# Patient Record
Sex: Female | Born: 1980 | Race: Black or African American | Hispanic: No | Marital: Single | Smoking: Former smoker
Health system: Southern US, Community
[De-identification: ages and names within clinical notes are randomized; demographics above are authoritative.]

## PROBLEM LIST (undated history)

## (undated) DIAGNOSIS — S82891A Other fracture of right lower leg, initial encounter for closed fracture: Secondary | ICD-10-CM

## (undated) DIAGNOSIS — N200 Calculus of kidney: Secondary | ICD-10-CM

## (undated) HISTORY — PX: KIDNEY STONE SURGERY: SHX686

## (undated) HISTORY — DX: Calculus of kidney: N20.0

## (undated) HISTORY — DX: Other fracture of right lower leg, initial encounter for closed fracture: S82.891A

---
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
## 2002-06-21 DIAGNOSIS — N200 Calculus of kidney: Secondary | ICD-10-CM

## 2002-06-21 HISTORY — DX: Calculus of kidney: N20.0

## 2002-07-26 ENCOUNTER — Other Ambulatory Visit: Admission: RE | Admit: 2002-07-26 | Discharge: 2002-07-26 | Payer: Self-pay | Admitting: Obstetrics and Gynecology

## 2002-12-30 ENCOUNTER — Inpatient Hospital Stay (HOSPITAL_COMMUNITY): Admission: AD | Admit: 2002-12-30 | Discharge: 2002-12-30 | Payer: Self-pay | Admitting: Obstetrics and Gynecology

## 2002-12-30 ENCOUNTER — Inpatient Hospital Stay (HOSPITAL_COMMUNITY): Admission: AD | Admit: 2002-12-30 | Discharge: 2003-01-02 | Payer: Self-pay | Admitting: Obstetrics and Gynecology

## 2003-04-16 ENCOUNTER — Emergency Department (HOSPITAL_COMMUNITY): Admission: AD | Admit: 2003-04-16 | Discharge: 2003-04-16 | Payer: Self-pay

## 2003-04-17 ENCOUNTER — Emergency Department (HOSPITAL_COMMUNITY): Admission: EM | Admit: 2003-04-17 | Discharge: 2003-04-17 | Payer: Self-pay | Admitting: Emergency Medicine

## 2003-04-19 ENCOUNTER — Emergency Department (HOSPITAL_COMMUNITY): Admission: EM | Admit: 2003-04-19 | Discharge: 2003-04-19 | Payer: Self-pay | Admitting: Emergency Medicine

## 2003-04-23 ENCOUNTER — Ambulatory Visit (HOSPITAL_COMMUNITY): Admission: AD | Admit: 2003-04-23 | Discharge: 2003-04-23 | Payer: Self-pay | Admitting: Urology

## 2003-09-12 ENCOUNTER — Emergency Department (HOSPITAL_COMMUNITY): Admission: EM | Admit: 2003-09-12 | Discharge: 2003-09-12 | Payer: Self-pay | Admitting: Family Medicine

## 2004-01-22 ENCOUNTER — Emergency Department (HOSPITAL_COMMUNITY): Admission: EM | Admit: 2004-01-22 | Discharge: 2004-01-22 | Payer: Self-pay | Admitting: Emergency Medicine

## 2004-08-12 ENCOUNTER — Emergency Department (HOSPITAL_COMMUNITY): Admission: EM | Admit: 2004-08-12 | Discharge: 2004-08-12 | Payer: Self-pay | Admitting: Emergency Medicine

## 2005-01-24 ENCOUNTER — Emergency Department (HOSPITAL_COMMUNITY): Admission: EM | Admit: 2005-01-24 | Discharge: 2005-01-24 | Payer: Self-pay | Admitting: Emergency Medicine

## 2006-01-19 ENCOUNTER — Emergency Department (HOSPITAL_COMMUNITY): Admission: EM | Admit: 2006-01-19 | Discharge: 2006-01-20 | Payer: Self-pay | Admitting: Emergency Medicine

## 2006-07-28 ENCOUNTER — Emergency Department (HOSPITAL_COMMUNITY): Admission: EM | Admit: 2006-07-28 | Discharge: 2006-07-28 | Payer: Self-pay | Admitting: Family Medicine

## 2006-10-14 ENCOUNTER — Encounter: Admission: RE | Admit: 2006-10-14 | Discharge: 2006-10-14 | Payer: Self-pay | Admitting: Family Medicine

## 2007-09-25 ENCOUNTER — Emergency Department (HOSPITAL_COMMUNITY): Admission: EM | Admit: 2007-09-25 | Discharge: 2007-09-25 | Payer: Self-pay | Admitting: Emergency Medicine

## 2008-03-21 ENCOUNTER — Emergency Department (HOSPITAL_COMMUNITY): Admission: EM | Admit: 2008-03-21 | Discharge: 2008-03-21 | Payer: Self-pay | Admitting: Family Medicine

## 2008-05-29 ENCOUNTER — Emergency Department (HOSPITAL_COMMUNITY): Admission: EM | Admit: 2008-05-29 | Discharge: 2008-05-29 | Payer: Self-pay | Admitting: Emergency Medicine

## 2009-01-11 ENCOUNTER — Inpatient Hospital Stay (HOSPITAL_COMMUNITY): Admission: AD | Admit: 2009-01-11 | Discharge: 2009-01-13 | Payer: Self-pay | Admitting: Obstetrics and Gynecology

## 2009-01-11 ENCOUNTER — Inpatient Hospital Stay (HOSPITAL_COMMUNITY): Admission: AD | Admit: 2009-01-11 | Discharge: 2009-01-11 | Payer: Self-pay | Admitting: Obstetrics and Gynecology

## 2010-07-09 ENCOUNTER — Emergency Department (HOSPITAL_COMMUNITY)
Admission: EM | Admit: 2010-07-09 | Discharge: 2010-07-09 | Payer: Self-pay | Source: Home / Self Care | Admitting: Family Medicine

## 2010-07-13 LAB — POCT PREGNANCY, URINE: Preg Test, Ur: NEGATIVE

## 2010-09-27 LAB — CBC
HCT: 31.6 % — ABNORMAL LOW (ref 36.0–46.0)
HCT: 37.1 % (ref 36.0–46.0)
Hemoglobin: 10.8 g/dL — ABNORMAL LOW (ref 12.0–15.0)
Hemoglobin: 12.5 g/dL (ref 12.0–15.0)
MCHC: 33.6 g/dL (ref 30.0–36.0)
MCHC: 34 g/dL (ref 30.0–36.0)
MCV: 82.6 fL (ref 78.0–100.0)
MCV: 83.6 fL (ref 78.0–100.0)
Platelets: 213 10*3/uL (ref 150–400)
Platelets: 233 10*3/uL (ref 150–400)
RBC: 3.78 MIL/uL — ABNORMAL LOW (ref 3.87–5.11)
RBC: 4.49 MIL/uL (ref 3.87–5.11)
RDW: 13.9 % (ref 11.5–15.5)
RDW: 13.9 % (ref 11.5–15.5)
WBC: 16.4 10*3/uL — ABNORMAL HIGH (ref 4.0–10.5)
WBC: 18.4 10*3/uL — ABNORMAL HIGH (ref 4.0–10.5)

## 2010-09-27 LAB — RPR: RPR Ser Ql: NONREACTIVE

## 2010-10-01 ENCOUNTER — Inpatient Hospital Stay (INDEPENDENT_AMBULATORY_CARE_PROVIDER_SITE_OTHER)
Admission: RE | Admit: 2010-10-01 | Discharge: 2010-10-01 | Disposition: A | Discharge status: Home / Self Care | Payer: Self-pay | Source: Ambulatory Visit | Attending: Emergency Medicine | Admitting: Emergency Medicine

## 2010-10-01 DIAGNOSIS — L03019 Cellulitis of unspecified finger: Secondary | ICD-10-CM

## 2010-10-13 ENCOUNTER — Inpatient Hospital Stay (INDEPENDENT_AMBULATORY_CARE_PROVIDER_SITE_OTHER)
Admission: RE | Admit: 2010-10-13 | Discharge: 2010-10-13 | Disposition: A | Discharge status: Home / Self Care | Payer: Self-pay | Source: Ambulatory Visit

## 2010-10-13 DIAGNOSIS — L03019 Cellulitis of unspecified finger: Secondary | ICD-10-CM

## 2010-11-03 NOTE — H&P (Signed)
NAME:  Susan Mcgrath, Susan Mcgrath                ACCOUNT NO.:  [id number]   MEDICAL RECORD NO.:  [id number]          PATIENT TYPE:  INP   LOCATION:  9145                          FACILITY:  [facility]   PHYSICIAN:  Hal Morales, M.D.DATE OF BIRTH:  [birth year]   DATE OF ADMISSION:  01/11/2009  DATE OF DISCHARGE:                              HISTORY & PHYSICAL   HISTORY OF PRESENT ILLNESS:  The patient is a 30 year old gravida 3,  para 1-0-1-1 who was admitted at 20 and 3/7th weeks with complaints of  regular and painful uterine contractions since 1:00 p.m. the day of  admission.  The patient denies leakage of fluid or bleeding.  The  patient's fetus has been moving normally.  The patient had been seen in  maternity admissions unit earlier in the day and discharged home due to  no cervical change.  The patient's pregnancy has been followed by the  CNM Service at [facility].  The patient presents remarkable  for  1. Positive group B strep.  2. History of kidney stone prior to pregnancy.  3. History of tric and gonorrhea in the past.  4. UTI with MRSA during pregnancy.   HISTORY OF PRESENT PREGNANCY:  The patient entered care at 79 weeks'  gestation.  The patient was treated with Macrobid at that time and urine  culture sent.  The patient also with bacterial vaginosis on wet prep and  was treated with MetroGel.  The patient's new OB lab, as well as all  screening tests were obtained at that time.  The patient's initial urine  with staph.  Antibiotics were changed at that time to Cipro.  At 19  weeks, ultrasound was obtained for anatomy with single intrauterine  pregnancy consistent with date.  Cervix 3.95 cm.  Normal anatomy.  The  patient declined quadruple screen at that time.  At 21 weeks', the  patient with complaints of a lesion on her lower abdomen consistent with  a blood follicle.  At that time, the patient was seen.  The patient with  no drainage noted and a solid nodule with  cellulitis.  The patient was  started on Keflex at that time.  The patient continued to have increased  pain and swelling of the area and at second visit at 23 weeks and 2  days, area measured 5-7 cm.  The patient was instructed to continue with  her antibiotics, but her course was extended to 10 days.  Followup visit  at 24 weeks revealed resolving lesion.  At 27 weeks, Glucola was given.  The abdominal lesion was resolved.  The patient's Glucola results 103,  hemoglobin of 10.9, and nonreactive RPR.  The patient was started on  over-the-counter iron a a result of her hemoglobin.  At 34 weeks, the  patient's fundal height was noted to be 37 cm and therefore ultrasound  was ordered to evaluate.  At 36 weeks, the patient underwent ultrasound  with estimated fetal weight at the 72.6 percentile and normal amniotic  fluid index.  Group B strep was collected at 36 weeks.  At 37  weeks, the  patient was 2+ glycosuria at the time of her office visit and Dextrostix  obtained at that time was 156; however, the patient reported consuming  two cups of fruit punch prior to visit.  Gonorrhea, chlamydia cultures  at 36 weeks were negative.  At 38 weeks, the patient with no complaints  and cervical exam was 1-2 cm, 40-50% effaced, -2 station, and vertex.  The patient with some complaints of some insomnia and comfort measures  were discussed with her.  Remainder of the patient's pregnancy was  uneventful.   PRENATAL LABORATORY DATA:  Initial prenatal labs, blood type A+,  antibody screen negative, hemoglobin 11.7, hematocrit 35.8, platelets  321,000.  Antibody screen negative, sickle cell trait negative, RPR  nonreactive, rubella titer immune, hepatitis B surface antigen negative,  HIV nonreactive.  Pap smear within normal limits.  Gonorrhea and  chlamydia cultures negative.  Quad screen was declined.  One-hour  Glucola at 27 weeks' 103, hemoglobin at 27 weeks 10.9, RPR 27 weeks  nonreactive.  Gonorrhea  and chlamydia cultures at 36 weeks negative.  Group B strep was positive.   Pregnancy #1, July 2004, spontaneous vaginal delivery, 40 weeks'  gestation.  A 7-hour labor.  Female infant 7 pounds 4 ounces.  No  complications.  Pregnancy #2 in 2005, elective interruption of pregnancy  at [redacted] weeks gestation.  Pregnancy #3 is current.   GYNECOLOGICAL HISTORY:  The patient with regular monthly menses.  The  patient is uncertain of LMP.  The patient has used condoms, Depo-  Provera, and birth control pills in the past.  The patient with history  of Trichomonas in 2007.  The patient with a history of gonorrhea in her  last pregnancy.  The patient does have a history of frequent yeast  infections in the past.  The patient with no previous history of  abnormal Pap.   MEDICAL HISTORY:  Remarkable for bare UTI, otherwise negative.   SURGICAL HISTORY:  Wisdom teeth 2004, removal of kidney stones.   FAMILY HISTORY:  Maternal grandmother with Alzheimer's, father with  bipolar disorder, and history of drug use.   GENETIC HISTORY:  The baby's sisters are fraternal twins.  Otherwise  negative.   SOCIAL HISTORY:  The patient is single.  The patient is a Pharmacist, hospital and a Consulting civil engineer.  The patient denies his use of alcohol, tobacco,  or street drugs.  Father of the baby, Doneta Public, is involved and supportive  and is a Consulting civil engineer.  The patient does not have a particular religious  affiliation.   ALLERGIES:  No known drug allergies.   CURRENT MEDICATIONS:  Prenatal vitamins in the year.   OBJECTIVE:  GENERAL:  The patient is afebrile.  The patient is an alert,  oriented in no apparent distress other than discomfort with contractions  and breathing with contractions.  VITAL SIGNS:  Blood pressure 112/78, heart rate 98, respirations 16.  SKIN:  The patient's skin is warm and dry.  Color is satisfactory.  NECK:  Thyroid without nodules, masses, or tenderness and no  lymphadenopathy is present.  HEART:   There is regular rate and rhythm without murmur, rub, or gallop.  LUNGS:  Clear to AP auscultation.  ABDOMEN:  Soft, gravid, and nontender.  Positive bowel sounds x4  quadrants.  Fundal height 41 cm.  Fetus is vertex to Leopold's  maneuvers.  Fetal heart rate baseline 120s with accelerations present  and variability present.  No decelerations are noted.  Uterine  contractions are noted to be every 4-5 minutes and strong to palpation.  Sterile vaginal exam of the cervix and found cervix to be 8 cm dilated,  completely effaced, -2 station and vertex for RN.  EXTREMITIES:  Negative edema, negative Homans bilaterally.  Deep tendon  reflexes are 1+ with no clonus.   ASSESSMENT:  1. Intrauterine pregnancy at 35 and 3/7th weeks gestation.  2. Active labor.   PLAN:  The patient to be admitted to birthing suite per consult with Dr.  Pennie Rushing.  Ampicillin was started for positive group B strep due to  advanced labor.  The patient desires epidural for anesthesia and  epidural was ordered.  Anticipate a vaginal delivery.      Rhona Leavens, CNM      Hal Morales, M.D.  Electronically Signed    NOS/MEDQ  D:  01/12/2009  T:  01/12/2009  Job:  [id number]

## 2010-11-06 NOTE — Op Note (Signed)
NAME:  Susan Mcgrath, Susan Mcgrath NO.:  [id number]   MEDICAL RECORD NO.:  [id number]                   PATIENT TYPE:  AMB   LOCATION:  DAY                                  FACILITY:  [facility]   PHYSICIAN:  [place] Service, M.D.             DATE OF BIRTH:  January 18, 1981   DATE OF PROCEDURE:  DATE OF DISCHARGE:                                 OPERATIVE REPORT   REFERRED BY:  Janine Limbo, M.D.   UROLOGIST:  [place] Service, M.D.   PREOPERATIVE DIAGNOSIS:  4 mm calculus left distal ureter.   POSTOPERATIVE DIAGNOSIS:  4 mm calculus left distal ureter.   PROCEDURE:  Cystoscopy, retrograde ureteroscopy and double J stent  placement.   INDICATIONS FOR PROCEDURE:  A 30 year old female [name] April 17, 2003 CT abdomen and pelvis moderate left hydronephrosis on the basis of a  distal ureteral stone measuring 2 x 4 mm.  The patient's has had six days  of pain, nausea and vomiting. KUB in the office showed suggestion of  persistent stone in the region of the left UVJ. Discussed situation with  family members, elected to proceed with cystoscopy retrograde and  ureteroscopy if necessary.   ANESTHESIA:  General.   DESCRIPTION OF PROCEDURE:  The patient was prepped and draped in the dorsal  lithotomy position after institution of an adequate level of general  anesthesia (LMA).  A 21 French panendoscope was gently inserted at the  urethral meatus, normal urethra and sphincter. Clear efflux right orifice.  The patient had bullous edema and minimal efflux at the left orifice. Right  retrograde was performed, normal course and caliber of the ureter, pelvis  and calices with prompt drainage in 2-3 minutes. Left retrograde showed  collection of small defects in the distal ureter with proximal  hydronephrosis. A guidewire was negotiated beyond the filling defects, end  hole catheter was inserted over the guidewire, left in place for 5 minutes.  The guidewire  was left in place, the end hole catheter was withdrawn. There  was immediate efflux of copious amounts of cloudy urine from the left  ureteral orifice. This was allowed to subside. The ureteroscope was inserted  along side the guidewire.  A collection of dilated proximal ureter with  distal ureter showing edema erythema consistent with previous stone. A  collection of small fragments were identified, no single large stone could  be identified however. The ureteroscope was inserted to the level of the  left UPJ, no proximal calculi were identified. The ureteroscope was then  withdrawn. Due to intense spasm of the distal ureter, a decision was made to  place a double J stent 6 French 24 cm over the indwelling guidewire.  Excellent pigtail formation on guidewire removal. The bladder was drained,  cystoscope was removed. The patient was given a B&O suppository and returned  to recovery in satisfactory condition.  [place] Service, M.D.    RH/MEDQ  D:  04/23/2003  T:  04/23/2003  Job:  [id number]

## 2010-11-06 NOTE — H&P (Signed)
NAME:  Susan Mcgrath, Susan Mcgrath NO.:  [id number]   MEDICAL RECORD NO.:  [id number]                   PATIENT TYPE:  INP   LOCATION:  9148                                 FACILITY:  [facility]   PHYSICIAN:  Janine Limbo, M.D.            DATE OF BIRTH:  1981-05-21   DATE OF ADMISSION:  12/30/2002  DATE OF DISCHARGE:                                HISTORY & PHYSICAL   Susan Mcgrath is a 30 year old gravida 1, para 0, EDD January 01, 2003, at 39-5/7  weeks by 18 week ultrasound confirmed with followup.  She presents with  increasing labor symptoms, contractions, increasing in frequency and  intensity since this afternoon.  She reports positive fetal movement.  No  bleeding.  No rupture of membranes.  No PIH symptoms.  She was evaluated  earlier today in MAU and sent home in prodromal labor.  Her cervix was found  to be one fingertip to one cm at that time and unchanged.  Her pregnancy has  been followed by the C.N.M. service at [facility] and is remarkable for (1) late  care, (2) gonorrhea in the second trimester, test of cure negative, (3)  Trichomonas in the first trimester which was treated, (4) positive group B  strep.   OBSTETRIC HISTORY:  Present pregnancy.   PRENATAL LAB WORK:  On July 26, 2002, hemoglobin and hematocrit 10.8 and  32.5.  Platelets 328,000.  Blood type and Rh A positive, antibody screen  negative, sickle cell trait negative, VDRL nonreactive, rubella immune.  Hepatitis B surface antigen negative.  HIV nonreactive.  On July 26, 2002, GC positive, Chlamydia negative.  On August 28, 2002, GC and Chlamydia  negative and also at 36 weeks on December 05, 2002 GC and Chlamydia negative.  Pap smear July 26, 2002 showed Trichomonas which was treated.  Quad  screen within normal range.  At 28 weeks, one hour glucose challenge 88 and  hemoglobin 11.1.  RPR nonreactive at that time at 36 weeks.  Culture of the  vaginal tract was positive for group B strep.   MEDICAL HISTORY:  Unremarkable.   SURGICAL HISTORY:  Wisdom teeth 2002.   FAMILY HISTORY:  Maternal grandmother with Alzheimer's disease.  Genetic  history - there is no history of familial or chromosomal disorder, died in  infancy, or that were born with birth defects.  The patient has no known  drug allergies and denies the use of tobacco, alcohol, or illicit drugs.   SOCIAL HISTORY:  Susan Mcgrath is a 30 year old single African-American female,  father of a baby, Susan Mcgrath.  The patient is Susan Mcgrath in her faith.  She  began prenatal care at [facility] on July 26, 2002 at 18 weeks.  Her pregnancy  was essentially unremarkable except for positive GC in second trimester  treated with Rocephin, Trichomonas treated with metronidazole.  Otherwise,  the patient has been size equal to  date throughout, normotensive with  proteinuria.   REVIEW OF SYSTEMS:  There are no signs or symptoms suggestive of focal or  systemic disease and the patient is typical of one with an uterine pregnancy  at term in early labor.   PHYSICAL EXAMINATION:  Vital signs stable, afebrile.  HEENT:  Unremarkable.  HEART:  Regular rate and rhythm.  LUNGS:  Clear.  ABDOMEN:  Gravid in its contour.  Uterine fundus is noted to extend 38 cm  above the level of the pubic symphysis.  Leopold's maneuvers finds the  infant to be in a longitudinal lie.  Cephalic presentation and the estimated  fetal weight is 7 1/2 pounds.  The fetal heart rate baseline is 140,  positive variability.  There is no reactivity at the present time.  The  strip is reassuring with occasional mild variable deceleration.  The patient  is contracting every two to five minutes.  Digital exam of the cervix is  found to be 4 cm dilated, 90% effaced, with cephalic presenting part and a  minus 1 station and a bulging bag of water.  EXTREMITIES:  Show no pathologic edema.  DTRs are 1+ with no clonus.   ASSESSMENT:  Intrauterine pregnancy at term, early active  labor.   PLAN:  Admit per Dr. Marline Backbone.  Routine C.N.M. orders.  Penicillin-G  prophylaxis for positive group B strep.  The patient may have an epidural.     Susan Mcgrath, C.N.M.               Janine Limbo, M.D.    SDM/MEDQ  D:  12/31/2002  T:  12/31/2002  Job:  [id number]

## 2010-12-28 ENCOUNTER — Inpatient Hospital Stay (INDEPENDENT_AMBULATORY_CARE_PROVIDER_SITE_OTHER)
Admission: RE | Admit: 2010-12-28 | Discharge: 2010-12-28 | Disposition: A | Discharge status: Home / Self Care | Payer: Self-pay | Source: Ambulatory Visit | Attending: Family Medicine | Admitting: Family Medicine

## 2010-12-28 DIAGNOSIS — Z3201 Encounter for pregnancy test, result positive: Secondary | ICD-10-CM

## 2010-12-28 LAB — POCT URINALYSIS DIP (DEVICE)
Bilirubin Urine: NEGATIVE
Glucose, UA: NEGATIVE mg/dL
Ketones, ur: NEGATIVE mg/dL
Nitrite: NEGATIVE
Protein, ur: NEGATIVE mg/dL
Specific Gravity, Urine: 1.02 (ref 1.005–1.030)
Urobilinogen, UA: 0.2 mg/dL (ref 0.0–1.0)
pH: 7 (ref 5.0–8.0)

## 2010-12-28 LAB — POCT PREGNANCY, URINE: Preg Test, Ur: POSITIVE

## 2011-03-22 LAB — POCT RAPID STREP A: Streptococcus, Group A Screen (Direct): NEGATIVE

## 2011-03-26 LAB — URINALYSIS, ROUTINE W REFLEX MICROSCOPIC
Bilirubin Urine: NEGATIVE
Glucose, UA: NEGATIVE mg/dL
Ketones, ur: >80 mg/dL — AB
Leukocytes, UA: NEGATIVE
Nitrite: NEGATIVE
Protein, ur: 30 mg/dL — AB
Specific Gravity, Urine: 1.034 — ABNORMAL HIGH (ref 1.005–1.030)
Urobilinogen, UA: 0.2 mg/dL (ref 0.0–1.0)
pH: 5.5 (ref 5.0–8.0)

## 2011-03-26 LAB — URINE MICROSCOPIC-ADD ON

## 2011-03-26 LAB — POCT PREGNANCY, URINE: Preg Test, Ur: POSITIVE

## 2011-04-21 ENCOUNTER — Inpatient Hospital Stay (INDEPENDENT_AMBULATORY_CARE_PROVIDER_SITE_OTHER)
Admission: RE | Admit: 2011-04-21 | Discharge: 2011-04-21 | Disposition: A | Discharge status: Home / Self Care | Payer: Self-pay | Source: Ambulatory Visit | Attending: Family Medicine | Admitting: Family Medicine

## 2011-04-21 ENCOUNTER — Ambulatory Visit (INDEPENDENT_AMBULATORY_CARE_PROVIDER_SITE_OTHER): Payer: Self-pay

## 2011-04-21 DIAGNOSIS — S93409A Sprain of unspecified ligament of unspecified ankle, initial encounter: Secondary | ICD-10-CM

## 2011-04-21 DIAGNOSIS — S82891A Other fracture of right lower leg, initial encounter for closed fracture: Secondary | ICD-10-CM

## 2011-04-21 HISTORY — DX: Other fracture of right lower leg, initial encounter for closed fracture: S82.891A

## 2011-05-09 ENCOUNTER — Emergency Department (HOSPITAL_COMMUNITY)
Admission: EM | Admit: 2011-05-09 | Discharge: 2011-05-09 | Disposition: A | Discharge status: Home / Self Care | Payer: Self-pay | Source: Home / Self Care | Attending: Emergency Medicine | Admitting: Emergency Medicine

## 2011-05-09 ENCOUNTER — Encounter: Payer: Self-pay | Admitting: *Deleted

## 2011-05-09 ENCOUNTER — Emergency Department (INDEPENDENT_AMBULATORY_CARE_PROVIDER_SITE_OTHER): Payer: Self-pay

## 2011-05-09 DIAGNOSIS — S93401A Sprain of unspecified ligament of right ankle, initial encounter: Secondary | ICD-10-CM

## 2011-05-09 DIAGNOSIS — S93409A Sprain of unspecified ligament of unspecified ankle, initial encounter: Secondary | ICD-10-CM

## 2011-05-09 NOTE — ED Provider Notes (Signed)
History     CSN: [id number] Arrival date & time: 05/09/2011  7:15 PM   First MD Initiated Contact with Patient 05/09/11 1840      Chief Complaint  Patient presents with  . Foot Pain    (Consider location/radiation/quality/duration/timing/severity/associated sxs/prior treatment) HPI Comments: She injured her right ankle on October 31. She fell and twisted the ankle. She was seen here and had the ankle x-rayed. The x-rays were negative for fracture. She was given crutches and an ASO brace. Ever since then she's had continued pain in the ankle and swelling. It hurts over the lateral malleolus. Pain is worse with weightbearing and with movement. Her toes feel numb and tingly.  Patient is a 30 y.o. female presenting with lower extremity pain.  Foot Pain    History reviewed. No pertinent past medical history.  History reviewed. No pertinent past surgical history.  History reviewed. No pertinent family history.  History  Substance Use Topics  . Smoking status: Current Everyday Smoker  . Smokeless tobacco: Not on file  . Alcohol Use: Yes    OB History    Grav Para Term Preterm Abortions TAB SAB Ect Mult Living                  Review of Systems  Musculoskeletal: Positive for arthralgias and gait problem. Negative for myalgias, back pain and joint swelling.  Skin: Negative for rash and wound.  Neurological: Negative for weakness and numbness.    Allergies  Review of patient's allergies indicates no known allergies.  Home Medications  No current outpatient prescriptions on file.  BP 115/59  Pulse 98  Temp(Src) 98.5 F (36.9 C) (Oral)  Resp 17  SpO2 100%  LMP 03/17/2011  Physical Exam  Nursing note and vitals reviewed. Constitutional: She is oriented to person, place, and time. She appears well-developed and well-nourished. No distress.  Musculoskeletal: Normal range of motion. She exhibits edema and tenderness.       There was slight swelling and tenderness to  palpation over the lateral malleolus. The ankle has a full range of motion actively and passively but with pain. Drawer sign was negative. Sensation was intact, muscle strength normal, and pulses were full.  Neurological: She is alert and oriented to person, place, and time. She has normal strength and normal reflexes. She displays no atrophy. No sensory deficit. She exhibits normal muscle tone.  Skin: Skin is warm and dry. No rash noted. She is not diaphoretic.    ED Course  Procedures (including critical care time)  Labs Reviewed - No data to display Dg Ankle Complete Right  05/09/2011  *RADIOLOGY REPORT*  Clinical Data: Persistent right ankle pain after injury on 04/21/2011.  RIGHT ANKLE - COMPLETE 3+ VIEW 05/09/2011:  Comparison: Right ankle x-rays 04/21/2011.  Findings: No evidence of acute, subacute, or healed fractures. Ankle mortise intact with well-preserved joint space.  No intrinsic osseous abnormalities.  No evidence of a significant joint effusion.  Interval resolution of the lateral soft tissue swelling.  IMPRESSION: Normal examination.  Original Report Authenticated By: Arnell Sieving, M.D.     1. Sprain of right ankle       MDM  She has a persistently swollen painful ankle after a twisting injury and over 2 weeks ago. Her x-ray was negative. Neuropsych she has a sprain, although I did recommend she followup with orthopedist. She was given some exercises to do and will switch to a Cam Walker.  Roque Lias, MD 05/09/11 2033

## 2011-05-09 NOTE — ED Notes (Signed)
After taking patient back to room from x-ray I explained to patient that the Radiologist at the hospital would read the x-rays.  I informed patient that process could take up to 45 minutes.  Patient stated she would not wait an additional 45 minutes.  Patient then stated "hold up.  I thought you were closed."  I explained to patient that yes we were closed but we stay until all patients are seen.

## 2011-05-09 NOTE — ED Notes (Signed)
Co pain in right foot, seen here on 10/31, had xrays, neg for fracture, states she is still using crutches and feels like it is not getting better

## 2011-05-24 ENCOUNTER — Ambulatory Visit (INDEPENDENT_AMBULATORY_CARE_PROVIDER_SITE_OTHER): Payer: Self-pay | Admitting: Family Medicine

## 2011-05-24 DIAGNOSIS — S93409A Sprain of unspecified ligament of unspecified ankle, initial encounter: Secondary | ICD-10-CM | POA: Insufficient documentation

## 2011-05-24 NOTE — Assessment & Plan Note (Signed)
Has had her sprain in a walking boot for a month. No fractures on xrays. NSAIDS prn PT and mobilization. Would dc walking boot.

## 2011-05-24 NOTE — Progress Notes (Signed)
  Subjective:    Susan Mcgrath is a 30 y.o. female who presents with right ankle pain. Onset of the symptoms was about a month ago. Inciting event: injured while falling from standing. Current symptoms include: ability to bear weight, but with some pain, pain with inversion of the foot, stiffness and swelling. Aggravating factors: direct pressure, standing, walking  and weight bearing. Symptoms have gradually improved. Patient has had no prior ankle problems. Evaluation to date: plain films: normal. Treatment to date: prescription NSAIDS which are somewhat effective.   Objective:    BP 120/60  Ht 5\' 6"  (1.676 m)  Wt 160 lb (72.576 kg)  BMI 25.82 kg/m2 Right ankle:   negative findings: no erythema, no ecchymosis, no effusion and no ligamentous laxity positive findings: decreased range of motion and tenderness over lateral talo-fibular  ligament right ankle swelling  Left ankle:   normal   Imaging: X-ray of the right ankle(s): no fracture, dislocation, swelling or degenerative changes noted    Assessment:    Ankle sprain  moderate  d/c cam walker boot---she is not sure she can walk without it so we discussed gradual d/c with interim return to ASO brace. HEP rtc 2-3 w Plan:    NSAIDs per medication orders. PT referral.

## 2011-06-11 ENCOUNTER — Ambulatory Visit: Payer: Self-pay | Admitting: Family Medicine

## 2011-12-14 ENCOUNTER — Ambulatory Visit (INDEPENDENT_AMBULATORY_CARE_PROVIDER_SITE_OTHER): Payer: Self-pay | Admitting: Family Medicine

## 2011-12-14 ENCOUNTER — Encounter: Payer: Self-pay | Admitting: Family Medicine

## 2011-12-14 VITALS — BP 113/75 | HR 103 | Temp 98.5°F | Ht 66.0 in | Wt 166.0 lb

## 2011-12-14 DIAGNOSIS — G629 Polyneuropathy, unspecified: Secondary | ICD-10-CM

## 2011-12-14 DIAGNOSIS — J029 Acute pharyngitis, unspecified: Secondary | ICD-10-CM

## 2011-12-14 DIAGNOSIS — G589 Mononeuropathy, unspecified: Secondary | ICD-10-CM

## 2011-12-14 LAB — POCT RAPID STREP A (OFFICE): Rapid Strep A Screen: NEGATIVE

## 2011-12-14 NOTE — Progress Notes (Signed)
Subjective:     Patient ID: Susan Mcgrath, female   DOB: 15-Jun-1981, 31 y.o.   MRN: [id number]  HPI 31 yo F new patient presents to establish primary care and discuss the following:  1. Groggy and tired x 1 week: associated with tingling in hands and feet and feeling sweaty. Patient denies sick contacts, fever, chills, poor sleep, polydipsia and polyuria, change in appetite and change in weight.   2. Cold symptoms: intermittent cough with sore throat. Off an on for a few weeks.   Past Medical History  Diagnosis Date  . Kidney stone 2004    Had stone removal   . Fracture of right ankle 04/21/11    Had casting no surgery    History reviewed. No pertinent past surgical history.  Family History  Problem Relation Age of Onset  . Alzheimer's disease Maternal Grandmother   . Alzheimer's disease Paternal Grandmother    History   Social History Narrative  . No narrative on file    Review of Systems MSK: intermittent R side pain.  GU: denies dysuria.   Objective:   Physical Exam BP 113/75  Pulse 103  Temp 98.5 F (36.9 C) (Oral)  Ht 5\' 6"  (1.676 m)  Wt 166 lb (75.297 kg)  BMI 26.79 kg/m2 General appearance: alert, cooperative and no distress Eyes: conjunctivae/corneas clear. PERRL, EOM's intact.  Ears: abnormal external canal right ear - cerumen removed by irrigation and normal TM noted after wax removal and abnormal external canal left ear - cerumen removed by irrigation and normal TM noted after wax removal Nose: Nares normal. Septum midline. Mucosa normal. No drainage or sinus tenderness. Throat: lips, mucosa, and tongue normal; teeth and gums normal Neck: no adenopathy, no carotid bruit, no JVD, supple, symmetrical, trachea midline and thyroid not enlarged, symmetric, no tenderness/mass/nodules Lungs: clear to auscultation bilaterally Heart: regular rate and rhythm, S1, S2 normal, no murmur, click, rub or gallop Abdomen: soft, non-tender; bowel sounds normal; no masses,   no organomegaly Extremities: extremities normal, atraumatic, no cyanosis or edema Pulses: 2+ and symmetric Skin: Skin color, texture, turgor normal. No rashes or lesions Neurologic: Grossly normal  Rapid strep: negative  Assessment and Plan:  See problem list

## 2011-12-14 NOTE — Patient Instructions (Addendum)
Eliska,  Thank you for coming in today. Please return once you have insurance for further evaluation of your symptoms. In the meantime please drink plenty of fluids (mostly water) and get as much rest as possible.   Dr. Armen Pickup

## 2011-12-14 NOTE — Assessment & Plan Note (Addendum)
A: tingling and numbness in extremities associated with constitutional symptoms. Unclear etiology. I have considered diabetes and thyroid dysfuction but patient has no other symptoms, considered viral infection which is highly possible given the acute onset of symptoms and short course thus far.  P:  -reassurance -will check labs: CMET and TSH at f/u visit.  -reviewed s/s to prompt patient to return to care sooner i.e. Acutely worsening symptoms.

## 2012-01-21 ENCOUNTER — Emergency Department (HOSPITAL_COMMUNITY)
Admission: EM | Admit: 2012-01-21 | Discharge: 2012-01-21 | Disposition: A | Discharge status: Home / Self Care | Payer: Medicaid Other | Attending: Emergency Medicine | Admitting: Emergency Medicine

## 2012-01-21 ENCOUNTER — Encounter (HOSPITAL_COMMUNITY): Payer: Self-pay | Admitting: *Deleted

## 2012-01-21 DIAGNOSIS — Z711 Person with feared health complaint in whom no diagnosis is made: Secondary | ICD-10-CM

## 2012-01-21 DIAGNOSIS — F17200 Nicotine dependence, unspecified, uncomplicated: Secondary | ICD-10-CM | POA: Insufficient documentation

## 2012-01-21 DIAGNOSIS — Z008 Encounter for other general examination: Secondary | ICD-10-CM | POA: Insufficient documentation

## 2012-01-21 LAB — CBC WITH DIFFERENTIAL/PLATELET
Basophils Absolute: 0 10*3/uL (ref 0.0–0.1)
Basophils Relative: 1 % (ref 0–1)
Eosinophils Absolute: 0.2 10*3/uL (ref 0.0–0.7)
Eosinophils Relative: 2 % (ref 0–5)
HCT: 39.1 % (ref 36.0–46.0)
Hemoglobin: 13 g/dL (ref 12.0–15.0)
Lymphocytes Relative: 30 % (ref 12–46)
Lymphs Abs: 2.5 10*3/uL (ref 0.7–4.0)
MCH: 26.9 pg (ref 26.0–34.0)
MCHC: 33.2 g/dL (ref 30.0–36.0)
MCV: 80.8 fL (ref 78.0–100.0)
Monocytes Absolute: 0.6 10*3/uL (ref 0.1–1.0)
Monocytes Relative: 7 % (ref 3–12)
Neutro Abs: 5 10*3/uL (ref 1.7–7.7)
Neutrophils Relative %: 60 % (ref 43–77)
Platelets: 353 10*3/uL (ref 150–400)
RBC: 4.84 MIL/uL (ref 3.87–5.11)
RDW: 13 % (ref 11.5–15.5)
WBC: 8.2 10*3/uL (ref 4.0–10.5)

## 2012-01-21 LAB — BASIC METABOLIC PANEL
BUN: 9 mg/dL (ref 6–23)
CO2: 27 mEq/L (ref 19–32)
Calcium: 9.7 mg/dL (ref 8.4–10.5)
Chloride: 105 mEq/L (ref 96–112)
Creatinine, Ser: 0.69 mg/dL (ref 0.50–1.10)
GFR calc Af Amer: >90 mL/min (ref >90)
GFR calc non Af Amer: >90 mL/min (ref >90)
Glucose, Bld: 75 mg/dL (ref 70–99)
Potassium: 3.7 mEq/L (ref 3.5–5.1)
Sodium: 140 mEq/L (ref 135–145)

## 2012-01-21 NOTE — ED Provider Notes (Signed)
History     CSN: [id number]  Arrival date & time 01/21/12  1314   First MD Initiated Contact with Patient 01/21/12 1404      Chief Complaint  Patient presents with  . Numbness    (Consider location/radiation/quality/duration/timing/severity/associated sxs/prior treatment) HPI  A generally healthy 31 year old female presents complaining of numbness to her feet. Patient states for the past month she has had aching and numbness sensation to the sole of both of her feet. Symptom has not improved nor get worse. Numbness is constant without associated pain. She denies any swelling, or rash. She denies taking medication a regular basis or any medication changes. She denies history of diabetes. Patient denies having trouble walking or having foot drop. No associated back pain, knee pain, or ankle pain. She denies any neuro-related disease in family. She does report feeling tired also usually in the past 3 weeks. Denies polydipsia, polyuria, or abnormal weight changes.  Past Medical History  Diagnosis Date  . Kidney stone 2004    Had stone removal   . Fracture of right ankle 04/21/11    Had casting no surgery     Past Surgical History  Procedure Date  . Kidney stone surgery     Family History  Problem Relation Age of Onset  . Alzheimer's disease Maternal Grandmother   . Alzheimer's disease Paternal Grandmother     History  Substance Use Topics  . Smoking status: Current Some Day Smoker -- 0.0 packs/day for 2 years    Types: Cigarettes  . Smokeless tobacco: Never Used  . Alcohol Use: Yes     Socially. Smokes when she drinks.     OB History    Grav Para Term Preterm Abortions TAB SAB Ect Mult Living   3 2 2  1 1    2       Review of Systems  All other systems reviewed and are negative.    Allergies  Review of patient's allergies indicates no known allergies.  Home Medications  No current outpatient prescriptions on file.  BP 112/68  Pulse 85  Temp 98.2 F (36.8  C) (Oral)  Resp 16  Ht 5\' 6"  (1.676 m)  Wt 162 lb (73.483 kg)  BMI 26.15 kg/m2  SpO2 100%  Physical Exam  Nursing note and vitals reviewed. Constitutional: She is oriented to person, place, and time. She appears well-developed and well-nourished. No distress.       Awake, alert, nontoxic appearance  HENT:  Head: Atraumatic.  Eyes: Conjunctivae are normal. Right eye exhibits no discharge. Left eye exhibits no discharge.  Neck: Neck supple.  Cardiovascular: Normal rate and regular rhythm.   Pulmonary/Chest: Effort normal. No respiratory distress. She exhibits no tenderness.  Abdominal: Soft. There is no tenderness. There is no rebound.  Musculoskeletal: Normal range of motion. She exhibits no edema and no tenderness.       ROM appears intact, no obvious focal weakness  Patient socially is unremarkable. Hip, knees, and ankles nontender on exam with full range of motion. Sensation is intact light touch throughout both feet. Pedal pulse palpable bilaterally. Normal skin tone, normal warmth.    No foot drop. Normal gait. Patellar deep tendon reflex 2+ bilaterally.  Lower extremities is without palpable cord, erythema, negative Homans sign.  Neurological: She is alert and oriented to person, place, and time.       Mental status and motor strength appears intact  Skin: Skin is warm. No rash noted.  Psychiatric: She has a normal  mood and affect.    ED Course  Procedures (including critical care time)   Labs Reviewed  CBC WITH DIFFERENTIAL  BASIC METABOLIC PANEL   No results found.   No diagnosis found.  Results for orders placed during the hospital encounter of 01/21/12  CBC WITH DIFFERENTIAL      Component Value Range   WBC 8.2  4.0 - 10.5 K/uL   RBC 4.84  3.87 - 5.11 MIL/uL   Hemoglobin 13.0  12.0 - 15.0 g/dL   HCT 16.1  09.6 - 04.5 %   MCV 80.8  78.0 - 100.0 fL   MCH 26.9  26.0 - 34.0 pg   MCHC 33.2  30.0 - 36.0 g/dL   RDW 40.9  81.1 - 91.4 %   Platelets 353  150 -  400 K/uL   Neutrophils Relative 60  43 - 77 %   Neutro Abs 5.0  1.7 - 7.7 K/uL   Lymphocytes Relative 30  12 - 46 %   Lymphs Abs 2.5  0.7 - 4.0 K/uL   Monocytes Relative 7  3 - 12 %   Monocytes Absolute 0.6  0.1 - 1.0 K/uL   Eosinophils Relative 2  0 - 5 %   Eosinophils Absolute 0.2  0.0 - 0.7 K/uL   Basophils Relative 1  0 - 1 %   Basophils Absolute 0.0  0.0 - 0.1 K/uL  BASIC METABOLIC PANEL      Component Value Range   Sodium 140  135 - 145 mEq/L   Potassium 3.7  3.5 - 5.1 mEq/L   Chloride 105  96 - 112 mEq/L   CO2 27  19 - 32 mEq/L   Glucose, Bld 75  70 - 99 mg/dL   BUN 9  6 - 23 mg/dL   Creatinine, Ser 7.82  0.50 - 1.10 mg/dL   Calcium 9.7  8.4 - 95.6 mg/dL   GFR calc non Af Amer >90  >90 mL/min   GFR calc Af Amer >90  >90 mL/min   No results found.  1. Worried well  MDM  Although patient complains of subjective numbness to both of her feet, and no concerning neuro finding on exam. No focal neuro deficit. She is afebrile and vital signs are stable.   3:19 PM Hemoglobin and electrolytes are within normal limits. Results discussed with patient with reassurance. Will give patient referral to follow with PCP for further management.  BP 111/64  Pulse 64  Temp 98.2 F (36.8 C) (Oral)  Resp 15  Ht 5\' 6"  (1.676 m)  Wt 162 lb (73.483 kg)  BMI 26.15 kg/m2  SpO2 99%    Fayrene Helper, PA-C 01/21/12 1521

## 2012-01-21 NOTE — ED Notes (Signed)
Pt comfortable reading personal book

## 2012-01-21 NOTE — ED Notes (Signed)
Pt resting quietly at this time.  Waiting for ED doc to assess.

## 2012-01-21 NOTE — ED Notes (Signed)
Patient reports she has had numbness in both feet with sweaty feet at times.  She also reports her hands feel numb at times.  Patient also reports she feels fatigued and tired.  Patient has had sx for 3 weeks.  She denies hx of diabetes.  Denies known hx of neuro diseases in family

## 2012-09-20 ENCOUNTER — Other Ambulatory Visit (HOSPITAL_COMMUNITY)
Admission: RE | Admit: 2012-09-20 | Discharge: 2012-09-20 | Disposition: A | Discharge status: Home / Self Care | Payer: Medicaid Other | Source: Ambulatory Visit | Attending: Family Medicine | Admitting: Family Medicine

## 2012-09-20 ENCOUNTER — Other Ambulatory Visit: Payer: Self-pay | Admitting: Family Medicine

## 2012-09-20 DIAGNOSIS — Z01419 Encounter for gynecological examination (general) (routine) without abnormal findings: Secondary | ICD-10-CM | POA: Insufficient documentation

## 2014-04-22 ENCOUNTER — Encounter (HOSPITAL_COMMUNITY): Payer: Self-pay | Admitting: *Deleted

## 2015-01-23 ENCOUNTER — Emergency Department (HOSPITAL_COMMUNITY)
Admission: EM | Admit: 2015-01-23 | Discharge: 2015-01-23 | Disposition: A | Discharge status: Home / Self Care | Payer: Self-pay | Source: Home / Self Care | Attending: Family Medicine | Admitting: Family Medicine

## 2015-01-23 ENCOUNTER — Encounter (HOSPITAL_COMMUNITY): Payer: Self-pay | Admitting: Emergency Medicine

## 2015-01-23 DIAGNOSIS — K219 Gastro-esophageal reflux disease without esophagitis: Secondary | ICD-10-CM

## 2015-01-23 MED ORDER — GI COCKTAIL ~~LOC~~
30.0000 mL | Freq: Once | ORAL | Status: AC
  Administered 2015-01-23: 30 mL via ORAL

## 2015-01-23 MED ORDER — GI COCKTAIL ~~LOC~~
ORAL | Status: AC
  Filled 2015-01-23: qty 30

## 2015-01-23 MED ORDER — OMEPRAZOLE 40 MG PO CPDR: 40.0000 mg | DELAYED_RELEASE_CAPSULE | Freq: Every day | ORAL | Status: AC

## 2015-01-23 NOTE — ED Notes (Signed)
Complains of chest pain that started august 1 .  Patient reports pain was intermittent initially.  Reports pain is constant now, and increases with movement causing sharp pain.  Baseline pain is an ache.  Patient also reports when she eats, when swallowing, food causes pain as it goes by the painful site in center chest.  Has not felt this way before.  Reports no nausea, no vomiting, and minimal sob.  Denies any fluttering in chest.

## 2015-01-23 NOTE — Discharge Instructions (Signed)
Take the Omeprazole to help with your pain.    Follow up with your regular PCP in about 2 weeks to make sure that you're getting better.    If you start having any worsening chest pain, shortness breath, nausea or vomiting, come back or go to the emergency room.   Gastroesophageal Reflux Disease, Adult Gastroesophageal reflux disease (GERD) happens when acid from your stomach flows up into the esophagus. When acid comes in contact with the esophagus, the acid causes soreness (inflammation) in the esophagus. Over time, GERD may create small holes (ulcers) in the lining of the esophagus. CAUSES   Increased body weight. This puts pressure on the stomach, making acid rise from the stomach into the esophagus.  Smoking. This increases acid production in the stomach.  Drinking alcohol. This causes decreased pressure in the lower esophageal sphincter (valve or ring of muscle between the esophagus and stomach), allowing acid from the stomach into the esophagus.  Late evening meals and a full stomach. This increases pressure and acid production in the stomach.  A malformed lower esophageal sphincter. Sometimes, no cause is found. SYMPTOMS   Burning pain in the lower part of the mid-chest behind the breastbone and in the mid-stomach area. This may occur twice a week or more often.  Trouble swallowing.  Sore throat.  Dry cough.  Asthma-like symptoms including chest tightness, shortness of breath, or wheezing. DIAGNOSIS  Your caregiver may be able to diagnose GERD based on your symptoms. In some cases, X-rays and other tests may be done to check for complications or to check the condition of your stomach and esophagus. TREATMENT  Your caregiver may recommend over-the-counter or prescription medicines to help decrease acid production. Ask your caregiver before starting or adding any new medicines.  HOME CARE INSTRUCTIONS   Change the factors that you can control. Ask your caregiver for  guidance concerning weight loss, quitting smoking, and alcohol consumption.  Avoid foods and drinks that make your symptoms worse, such as:  Caffeine or alcoholic drinks.  Chocolate.  Peppermint or mint flavorings.  Garlic and onions.  Spicy foods.  Citrus fruits, such as oranges, lemons, or limes.  Tomato-based foods such as sauce, chili, salsa, and pizza.  Fried and fatty foods.  Avoid lying down for the 3 hours prior to your bedtime or prior to taking a nap.  Eat small, frequent meals instead of large meals.  Wear loose-fitting clothing. Do not wear anything tight around your waist that causes pressure on your stomach.  Raise the head of your bed 6 to 8 inches with wood blocks to help you sleep. Extra pillows will not help.  Only take over-the-counter or prescription medicines for pain, discomfort, or fever as directed by your caregiver.  Do not take aspirin, ibuprofen, or other nonsteroidal anti-inflammatory drugs (NSAIDs). SEEK IMMEDIATE MEDICAL CARE IF:   You have pain in your arms, neck, jaw, teeth, or back.  Your pain increases or changes in intensity or duration.  You develop nausea, vomiting, or sweating (diaphoresis).  You develop shortness of breath, or you faint.  Your vomit is green, yellow, black, or looks like coffee grounds or blood.  Your stool is red, bloody, or black. These symptoms could be signs of other problems, such as heart disease, gastric bleeding, or esophageal bleeding. MAKE SURE YOU:   Understand these instructions.  Will watch your condition.  Will get help right away if you are not doing well or get worse. Document Released: 03/17/2005 Document Revised: 08/30/2011  Document Reviewed: 12/25/2010 [facility] Patient Information 2015 [place]. This information is not intended to replace advice given to you by your health care provider. Make sure you discuss any questions you have with your health care provider.

## 2015-01-23 NOTE — ED Provider Notes (Signed)
CSN: [id number]     Arrival date & time 01/23/15  1601 History   First MD Initiated Contact with Patient 01/23/15 1652     Chief Complaint  Patient presents with  . Chest Pain   (Consider location/radiation/quality/duration/timing/severity/associated sxs/prior Treatment) HPI 34 year old female who presents with 3-4 day history of chest pain. Describes pain as sharp, stabbing in center of her chest that is worse when she eats food. Worse when she lies flat. Her past day or so spell that sometimes food gets stuck when she tries to eat. Has not tried anything for relief. Has never had anything like this before. States she's never been diagnosed with indigestion or reflux. She drinks alcohol 3-4 drinks a week. She smokes daily. She drinks caffeine daily.  No chest pain on exertion. No dyspnea. No nausea or vomiting.  Pain not affected by exertion.    Past Medical History  Diagnosis Date  . Kidney stone 2004    Had stone removal   . Fracture of right ankle 04/21/11    Had casting no surgery    Past Surgical History  Procedure Laterality Date  . Kidney stone surgery     Family History  Problem Relation Age of Onset  . Alzheimer's disease Maternal Grandmother   . Alzheimer's disease Paternal Grandmother    History  Substance Use Topics  . Smoking status: Current Some Day Smoker -- 0.05 packs/day for 2 years    Types: Cigarettes  . Smokeless tobacco: Never Used  . Alcohol Use: Yes     Comment: Socially. Smokes when she drinks.    OB History    Gravida Para Term Preterm AB TAB SAB Ectopic Multiple Living   Review of Systems  Allergies  Review of patient's allergies indicates no known allergies.  Home Medications   Prior to Admission medications   Medication Sig Start Date End Date Taking? Authorizing Provider  omeprazole (PRILOSEC) 40 MG capsule Take 1 capsule (40 mg total) by mouth daily. 01/23/15   [name], MD   BP 121/69 mmHg  Pulse 81   Temp(Src) 98.2 F (36.8 C) (Oral)  Resp 16  SpO2 99% Physical Exam  Gen:  Alert, cooperative patient who appears stated age in no acute distress.  Vital signs reviewed. HEENT:  [place]/AT.  EOMI, PERRL.  MMM, tonsils non-erythematous, non-edematous.  External ears WNL, Bilateral TM's normal without retraction, redness or bulging.  Neck: No masses or thyromegaly or limitation in range of motion.  No cervical lymphadenopathy. Cardiac:  Regular rate and rhythm without murmur auscultated.  Good S1/S2. Pulm:  Clear to auscultation bilaterally with good air movement.  No wheezes or rales noted.   Abd:  Soft/nondistended/nontender.  Good bowel sounds throughout all four quadrants.  No masses noted.    ED Course  Procedures (including critical care time) Labs Review Labs Reviewed - No data to display  Imaging Review No results found.   MDM   1. Gastroesophageal reflux disease, esophagitis presence not specified   - no evidence of cardiac chest pain.   Treat with Omeprazole.  FU with PCP to assess for improvement.  Warning precautions provided.      [name], MD 01/23/15 [id number]

## 2016-07-02 ENCOUNTER — Emergency Department (HOSPITAL_COMMUNITY)
Admission: EM | Admit: 2016-07-02 | Discharge: 2016-07-02 | Disposition: A | Discharge status: Home / Self Care | Payer: No Typology Code available for payment source | Attending: Emergency Medicine | Admitting: Emergency Medicine

## 2016-07-02 ENCOUNTER — Encounter (HOSPITAL_COMMUNITY): Payer: Self-pay | Admitting: Emergency Medicine

## 2016-07-02 DIAGNOSIS — Y939 Activity, unspecified: Secondary | ICD-10-CM | POA: Diagnosis not present

## 2016-07-02 DIAGNOSIS — Y92410 Unspecified street and highway as the place of occurrence of the external cause: Secondary | ICD-10-CM | POA: Diagnosis not present

## 2016-07-02 DIAGNOSIS — Z79899 Other long term (current) drug therapy: Secondary | ICD-10-CM | POA: Insufficient documentation

## 2016-07-02 DIAGNOSIS — S199XXA Unspecified injury of neck, initial encounter: Secondary | ICD-10-CM | POA: Diagnosis present

## 2016-07-02 DIAGNOSIS — S161XXA Strain of muscle, fascia and tendon at neck level, initial encounter: Secondary | ICD-10-CM | POA: Diagnosis not present

## 2016-07-02 DIAGNOSIS — Z87891 Personal history of nicotine dependence: Secondary | ICD-10-CM | POA: Insufficient documentation

## 2016-07-02 DIAGNOSIS — Y999 Unspecified external cause status: Secondary | ICD-10-CM | POA: Insufficient documentation

## 2016-07-02 MED ORDER — METHOCARBAMOL 500 MG PO TABS: ORAL_TABLET | ORAL | 0 refills | Status: AC

## 2016-07-02 MED ORDER — LIDOCAINE 4 % EX CREA: 1.0000 "application " | TOPICAL_CREAM | Freq: Three times a day (TID) | CUTANEOUS | 0 refills | Status: AC | PRN

## 2016-07-02 MED ORDER — IBUPROFEN 400 MG PO TABS
400.0000 mg | ORAL_TABLET | Freq: Once | ORAL | Status: AC
  Administered 2016-07-02: 400 mg via ORAL
  Filled 2016-07-02: qty 1
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
## 2016-07-02 NOTE — Discharge Instructions (Signed)
For pain control you may take up to 800mg of Motrin (also known as ibuprofen). That is usually 4 over the counter pills,  3 times a day. Take with food to minimize stomach irritation   ° °For breakthrough pain you may take Robaxin. Do not drink alcohol, drive or operate heavy machinery when taking Robaxin. ° °Please follow with your primary care doctor in the next 2 days for a check-up. They must obtain records for further management.  ° °Do not hesitate to return to the Emergency Department for any new, worsening or concerning symptoms.  ° ° °
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
## 2016-07-09 ENCOUNTER — Encounter (HOSPITAL_COMMUNITY): Payer: Self-pay | Admitting: Emergency Medicine

## 2016-07-09 ENCOUNTER — Ambulatory Visit (HOSPITAL_COMMUNITY)
Admission: EM | Admit: 2016-07-09 | Discharge: 2016-07-09 | Disposition: A | Discharge status: Home / Self Care | Payer: Medicaid Other | Attending: Family Medicine | Admitting: Family Medicine

## 2016-07-09 DIAGNOSIS — B354 Tinea corporis: Secondary | ICD-10-CM

## 2016-07-09 MED ORDER — TERBINAFINE HCL 250 MG PO TABS: 250.0000 mg | ORAL_TABLET | Freq: Every day | ORAL | 0 refills | Status: AC

## 2016-07-09 NOTE — ED Triage Notes (Signed)
Here for rash on left axilla and abd onset 2 weeks  Reports new body lotions  Denies fevers, chills,  A&O x4... NAD

## 2016-07-09 NOTE — ED Provider Notes (Signed)
CSN: [id number]     Arrival date & time 07/09/16  [id number] History   None    No chief complaint on file.  (Consider location/radiation/quality/duration/timing/severity/associated sxs/prior Treatment) 36 year old female presents with two week history of rash to face, arms, axilla, chest, and back. She has no new detergents, shampoos, soaps, body washes, cloths, or other sources of allergen. No one else in her house hold has similar symptoms. She describes the rashes as itchy and has been applying ointment to them without relief.   The history is provided by the patient.    Past Medical History:  Diagnosis Date  . Fracture of right ankle 04/21/11   Had casting no surgery   . Kidney stone 2004   Had stone removal    Past Surgical History:  Procedure Laterality Date  . KIDNEY STONE SURGERY     Family History  Problem Relation Age of Onset  . Alzheimer's disease Maternal Grandmother   . Alzheimer's disease Paternal Grandmother    Social History  Substance Use Topics  . Smoking status: Former Smoker    Packs/day: 0.05    Years: 2.00    Types: Cigarettes  . Smokeless tobacco: Never Used  . Alcohol use Yes     Comment: Socially. Smokes when she drinks.    OB History    Gravida Para Term Preterm AB Living   3 2 2   1 2    SAB TAB Ectopic Multiple Live Births     1           Review of Systems  Reason unable to perform ROS: as covered in HPI.  All other systems reviewed and are negative.   Allergies  Patient has no known allergies.  Home Medications   Prior to Admission medications   Medication Sig Start Date End Date Taking? Authorizing Provider  lidocaine (LMX) 4 % cream Apply 1 application topically 3 (three) times daily as needed. 07/02/16   Nicole Pisciotta, PA-C  methocarbamol (ROBAXIN) 500 MG tablet Can take up to 1-2 tabs every 6 hours PRN PAIN 07/02/16   Joni ReiningNicole Pisciotta, PA-C  omeprazole (PRILOSEC) 40 MG capsule Take 1 capsule (40 mg total) by mouth daily. 01/23/15    Tobey GrimJeffrey H Walden, MD  terbinafine (LAMISIL) 250 MG tablet Take 1 tablet (250 mg total) by mouth daily. 07/09/16   Dorena BodoLawrence Dondi Aime, NP   Meds Ordered and Administered this Visit  Medications - No data to display  BP 124/79 (BP Location: Left Arm)   Pulse 80   Temp 98.2 F (36.8 C) (Oral)   Resp 16   LMP 07/02/2016   SpO2 99%  No data found.   Physical Exam  Constitutional: She is oriented to person, place, and time. She appears well-developed and well-nourished.  Neurological: She is alert and oriented to person, place, and time.  Skin: Skin is warm and dry. Capillary refill takes less than 2 seconds. Lesion noted.  Multiple annular, raised, erythermic, scaling lesions on face, chest, abdomin, arms, and back.  Psychiatric: She has a normal mood and affect.  Nursing note and vitals reviewed.   Urgent Care Course     Procedures (including critical care time)  Labs Review Labs Reviewed - No data to display  Imaging Review No results found.   Visual Acuity Review  Right Eye Distance:   Left Eye Distance:   Bilateral Distance:    Right Eye Near:   Left Eye Near:    Bilateral Near:  MDM   1. Tinea corporis   I believe you most likely have a condition called Tinea Corporis, or ring worm. I have prescribed a medicine called terbinafine. Take one tablet every day for 4 weeks. This may not be long enough to clear the infection. However, due to possible adverse effects of the drug, I recommend you establish with a primary care provider who can monitor your liver function and monitor the progress of clearing the infection. Should your infection not clear you will need additional treatment and I recommend you see a primary care provider to manage your condition.     Dorena Bodo, NP 07/09/16 1550

## 2016-07-09 NOTE — Discharge Instructions (Signed)
I believe you most likely have a condition called Tinea Corporis, or ring worm. I have prescribed a medicine called terbinafine. Take one tablet every day for 4 weeks. This may not be long enough to clear the infection. However, due to possible adverse effects of the drug, I recommend you establish with a primary care provider who can monitor your liver function and monitor the progress of clearing the infection. Should your infection not clear you will need additional treatment and I recommend you see a primary care provider to manage your condition.

## 2019-06-13 ENCOUNTER — Emergency Department (HOSPITAL_COMMUNITY): Payer: Self-pay

## 2019-06-13 ENCOUNTER — Other Ambulatory Visit: Payer: Self-pay

## 2019-06-13 ENCOUNTER — Encounter (HOSPITAL_COMMUNITY): Payer: Self-pay | Admitting: Emergency Medicine

## 2019-06-13 ENCOUNTER — Emergency Department (HOSPITAL_COMMUNITY)
Admission: EM | Admit: 2019-06-13 | Discharge: 2019-06-13 | Disposition: A | Discharge status: Home / Self Care | Payer: Self-pay | Attending: Emergency Medicine | Admitting: Emergency Medicine

## 2019-06-13 DIAGNOSIS — Z87891 Personal history of nicotine dependence: Secondary | ICD-10-CM | POA: Insufficient documentation

## 2019-06-13 DIAGNOSIS — W2209XA Striking against other stationary object, initial encounter: Secondary | ICD-10-CM | POA: Insufficient documentation

## 2019-06-13 DIAGNOSIS — S90121A Contusion of right lesser toe(s) without damage to nail, initial encounter: Secondary | ICD-10-CM | POA: Insufficient documentation

## 2019-06-13 DIAGNOSIS — Y999 Unspecified external cause status: Secondary | ICD-10-CM | POA: Insufficient documentation

## 2019-06-13 DIAGNOSIS — Z79899 Other long term (current) drug therapy: Secondary | ICD-10-CM | POA: Insufficient documentation

## 2019-06-13 DIAGNOSIS — M79674 Pain in right toe(s): Secondary | ICD-10-CM

## 2019-06-13 DIAGNOSIS — Y9301 Activity, walking, marching and hiking: Secondary | ICD-10-CM | POA: Insufficient documentation

## 2019-06-13 DIAGNOSIS — Y92039 Unspecified place in apartment as the place of occurrence of the external cause: Secondary | ICD-10-CM | POA: Insufficient documentation

## 2019-06-13 LAB — POC URINE PREG, ED: Preg Test, Ur: NEGATIVE

## 2019-06-13 MED ORDER — HYDROCODONE-ACETAMINOPHEN 5-325 MG PO TABS
1.0000 | ORAL_TABLET | Freq: Once | ORAL | Status: AC
  Administered 2019-06-13: 1 via ORAL
  Filled 2019-06-13: qty 1

## 2019-06-13 MED ORDER — NAPROXEN 500 MG PO TABS: 500.0000 mg | ORAL_TABLET | Freq: Two times a day (BID) | ORAL | 0 refills | Status: AC | PRN

## 2019-06-13 MED ORDER — CYCLOBENZAPRINE HCL 10 MG PO TABS: 10.0000 mg | ORAL_TABLET | Freq: Two times a day (BID) | ORAL | 0 refills | Status: AC | PRN

## 2019-06-13 NOTE — ED Triage Notes (Signed)
Pt reports hitting her R 4th digit about a week ago, states toe is swollen and bruised and she has difficulty walking. Was using ice and elevating without much relief.

## 2019-06-13 NOTE — ED Provider Notes (Signed)
[facility] Provider Note   CSN: [id number] Arrival date & time: 06/13/19  1049     History Chief Complaint  Patient presents with  . Toe Pain    Susan LentoOctavia C Mcgrath is a 38 y.o. female with past medical history as listed below presents to emergency room today with chief complaint of right fourth toe pain x11 days.  Patient states she was walking quickly out of her bedroom and accidentally stubbed her fourth toe on the couch.  She had immediate pain and bruising.  She denies any open wounds or cuts.  She has been able to walk and bear weight but states it is painful. Pain is constant. She describes pain as aching sensation. She rates the pain 8/10 in severity. She has been taking tylenol without symptom relief. No pain medication recently.  She is having difficulty sleeping at night because of the pain.  Denies falling, hitting her head or loss of consciousness.  Denies any bleeding, numbness, tingling, weakness.   Past Medical History:  Diagnosis Date  . Fracture of right ankle 04/21/11   Had casting no surgery   . Kidney stone 2004   Had stone removal     Patient Active Problem List   Diagnosis Date Noted  . Neuropathy 12/14/2011    Past Surgical History:  Procedure Laterality Date  . KIDNEY STONE SURGERY       OB History    Gravida  3   Para  2   Term  2   Preterm      AB  1   Living  2     SAB      TAB  1   Ectopic      Multiple      Live Births              Family History  Problem Relation Age of Onset  . Alzheimer's disease Maternal Grandmother   . Alzheimer's disease Paternal Grandmother     Social History   Tobacco Use  . Smoking status: Former Smoker    Packs/day: 0.05    Years: 2.00    Pack years: 0.10    Types: Cigarettes  . Smokeless tobacco: Never Used  Substance Use Topics  . Alcohol use: Yes    Comment: Socially. Smokes when she drinks.   . Drug use: No    Home Medications Prior  to Admission medications   Medication Sig Start Date End Date Taking? Authorizing Provider  cyclobenzaprine (FLEXERIL) 10 MG tablet Take 1 tablet (10 mg total) by mouth 2 (two) times daily as needed for muscle spasms. 06/13/19   Byrd Rushlow E, PA-C  lidocaine (LMX) 4 % cream Apply 1 application topically 3 (three) times daily as needed. 07/02/16   Pisciotta, Joni ReiningNicole, PA-C  methocarbamol (ROBAXIN) 500 MG tablet Can take up to 1-2 tabs every 6 hours PRN PAIN 07/02/16   Pisciotta, Joni ReiningNicole, PA-C  naproxen (NAPROSYN) 500 MG tablet Take 1 tablet (500 mg total) by mouth 2 (two) times daily as needed for up to 14 days. 06/13/19 06/27/19  Famous Eisenhardt E, PA-C  omeprazole (PRILOSEC) 40 MG capsule Take 1 capsule (40 mg total) by mouth daily. 01/23/15   Tobey GrimWalden, Jeffrey H, MD  terbinafine (LAMISIL) 250 MG tablet Take 1 tablet (250 mg total) by mouth daily. 07/09/16   Dorena BodoKennard, Lawrence, NP    Allergies    Patient has no known allergies.  Review of Systems   Review of Systems  All other systems are reviewed and are negative for acute change except as noted in the HPI.  Physical Exam Updated Vital Signs BP 119/80   Pulse 79   Temp 98.4 F (36.9 C) (Oral)   Resp 16   LMP 05/30/2019   SpO2 100%   Physical Exam Vitals and nursing note reviewed.  Constitutional:      Appearance: She is well-developed. She is not ill-appearing or toxic-appearing.  HENT:     Head: Normocephalic and atraumatic.     Nose: Nose normal.  Eyes:     General: No scleral icterus.       Right eye: No discharge.        Left eye: No discharge.     Conjunctiva/sclera: Conjunctivae normal.  Neck:     Vascular: No JVD.  Cardiovascular:     Rate and Rhythm: Normal rate and regular rhythm.     Pulses: Normal pulses.          Dorsalis pedis pulses are 2+ on the right side and 2+ on the left side.     Heart sounds: Normal heart sounds.  Pulmonary:     Effort: Pulmonary effort is normal.     Breath sounds: Normal breath  sounds.  Abdominal:     General: There is no distension.  Musculoskeletal:        General: Normal range of motion.     Cervical back: Normal range of motion.     Comments: There is no swelling and tenderness over the lateral malleolus. No overt deformity. No tenderness over the medial aspect of the ankle. The fifth metatarsal is not tender. The ankle joint is intact without excessive opening on stressing. Mild swelling and bruising to right fourth toe. No TTP or swelling of fore foot or calf. No break in skin. Good pedal pulse and cap refill of all toes. Wiggling toes without difficulty. She can ambulate without difficulty, steady gait.  Skin:    General: Skin is warm and dry.  Neurological:     Mental Status: She is oriented to person, place, and time.     GCS: GCS eye subscore is 4. GCS verbal subscore is 5. GCS motor subscore is 6.     Comments: Fluent speech, no facial droop.  Psychiatric:        Behavior: Behavior normal.       ED Results / Procedures / Treatments   Labs (all labs ordered are listed, but only abnormal results are displayed) Labs Reviewed  POC URINE PREG, ED    EKG None  Radiology DG Foot Complete Right  Result Date: 06/13/2019 CLINICAL DATA:  Fourth toe pain after injury 1 week prior EXAM: RIGHT FOOT COMPLETE - 3+ VIEW COMPARISON:  None. FINDINGS: Alignment is anatomic. There is no acute fracture. Joint spaces are preserved. IMPRESSION: No acute fracture. Electronically Signed   By: Macy Mis M.D.   On: 06/13/2019 11:49    Procedures Procedures (including critical care time)  Medications Ordered in ED Medications  HYDROcodone-acetaminophen (NORCO/VICODIN) 5-325 MG per tablet 1 tablet (1 tablet Oral Given 06/13/19 1304)    ED Course  I have reviewed the triage vital signs and the nursing notes.  Pertinent labs & imaging results that were available during my care of the patient were reviewed by me and considered in my medical decision making  (see chart for details).  Clinical Course as of Jun 13 1303  Wed Jun 13, 2019  1224 Xray of right foot viewed  by me without signs of fracture or dislocation.  DG Foot Complete Right [KA]  1229 Pregnancy test is negative  POC urine preg, ED [KA]    Clinical Course User Index [KA] Malaky Tetrault, Caroleen Hamman, PA-C   MDM Rules/Calculators/A&P                     Patient presents to the ED with complaints of pain to the  4th right toe s/p stubbing toe x 11 days ago. Exam without obvious deformity or open wounds. ROM intact. Tender to palpation with bruising seen. NVI distally. Xray viewed by me is negative for fracture/dislocation. Post op splint provided.  Patient ambulated in the room with steady gait. I have reviewed the PDMP during this encounter.  Patient given 1 dose of pain medicine here in the ED. Naproxen and tylenol recommended at discharge. Pt has had difficulty sleeping because of pain, will prescribe short course of muscle relaxers for pain. Discussed results, treatment plan, need for follow-up, and return precautions with the patient. Provided opportunity for questions, patient confirmed understanding and are in agreement with plan.    Portions of this note were generated with Scientist, clinical (histocompatibility and immunogenetics). Dictation errors may occur despite best attempts at proofreading.  Final Clinical Impression(s) / ED Diagnoses Final diagnoses:  Toe pain, right    Rx / DC Orders ED Discharge Orders         Ordered    naproxen (NAPROSYN) 500 MG tablet  2 times daily PRN     06/13/19 1219    cyclobenzaprine (FLEXERIL) 10 MG tablet  2 times daily PRN     06/13/19 1226           Ryoma Nofziger, [place], PA-C 06/13/19 1304    Charlynne Pander, MD 06/13/19 1520

## 2019-06-13 NOTE — ED Notes (Signed)
Pt returned from xray

## 2019-06-13 NOTE — ED Notes (Signed)
Pt taken to Xray.

## 2019-06-13 NOTE — Discharge Instructions (Addendum)
You were seen today for pain in your right fourth toe. Please read and follow all provided instructions.   The xray today shows the toe is not broken or dislocated  1. Medications: alternate naprosyn and tylenol for pain control, usual home medications.  Naprosyn is an anti-inflammatory medicine so you should not take ibuprofen, Motrin, Advil, Goody powders or Aleve while taking this medicine. -Also sent prescription for Flexeril muscle relaxer to your pharmacy. This can make you drowsy so you should not drive or work after taking.   2. Treatment: rest, ice, elevate and use brace, drink plenty of fluids, gentle stretching  3. Follow Up: Please followup with PCP in 1 week if no improvement for discussion of your diagnoses and further evaluation after today's visit; if you do not have a primary care doctor use the resource guide provided to find one; Please return to the ER for worsening symptoms or other concerns

## 2020-07-01 IMAGING — CR DG FOOT COMPLETE 3+V*R*
3 series · 3 of 3 positions shown · non-contrast
Comparison: None.

CLINICAL DATA: Fourth toe pain after injury 1 week prior

EXAM:
RIGHT FOOT COMPLETE - 3+ VIEW

[foot ap]
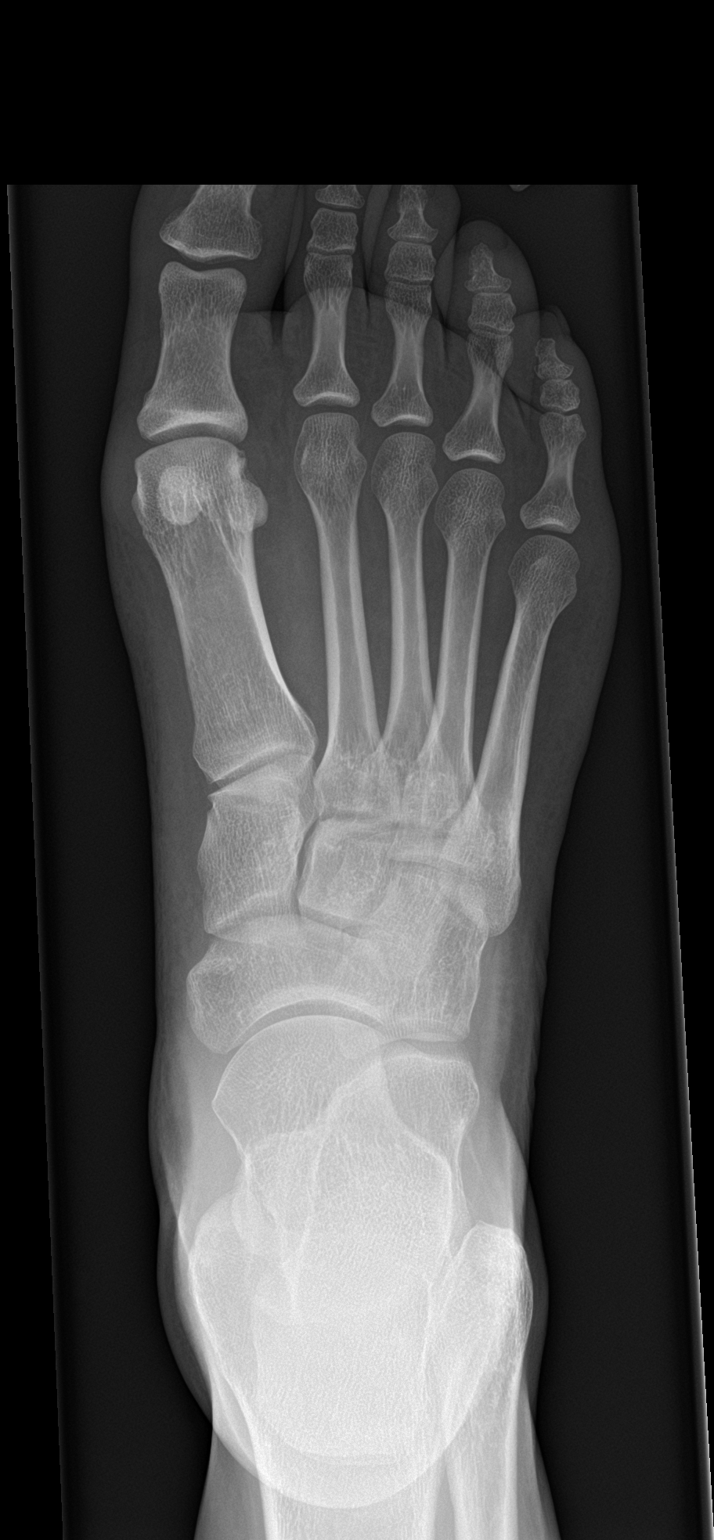

[foot obl]
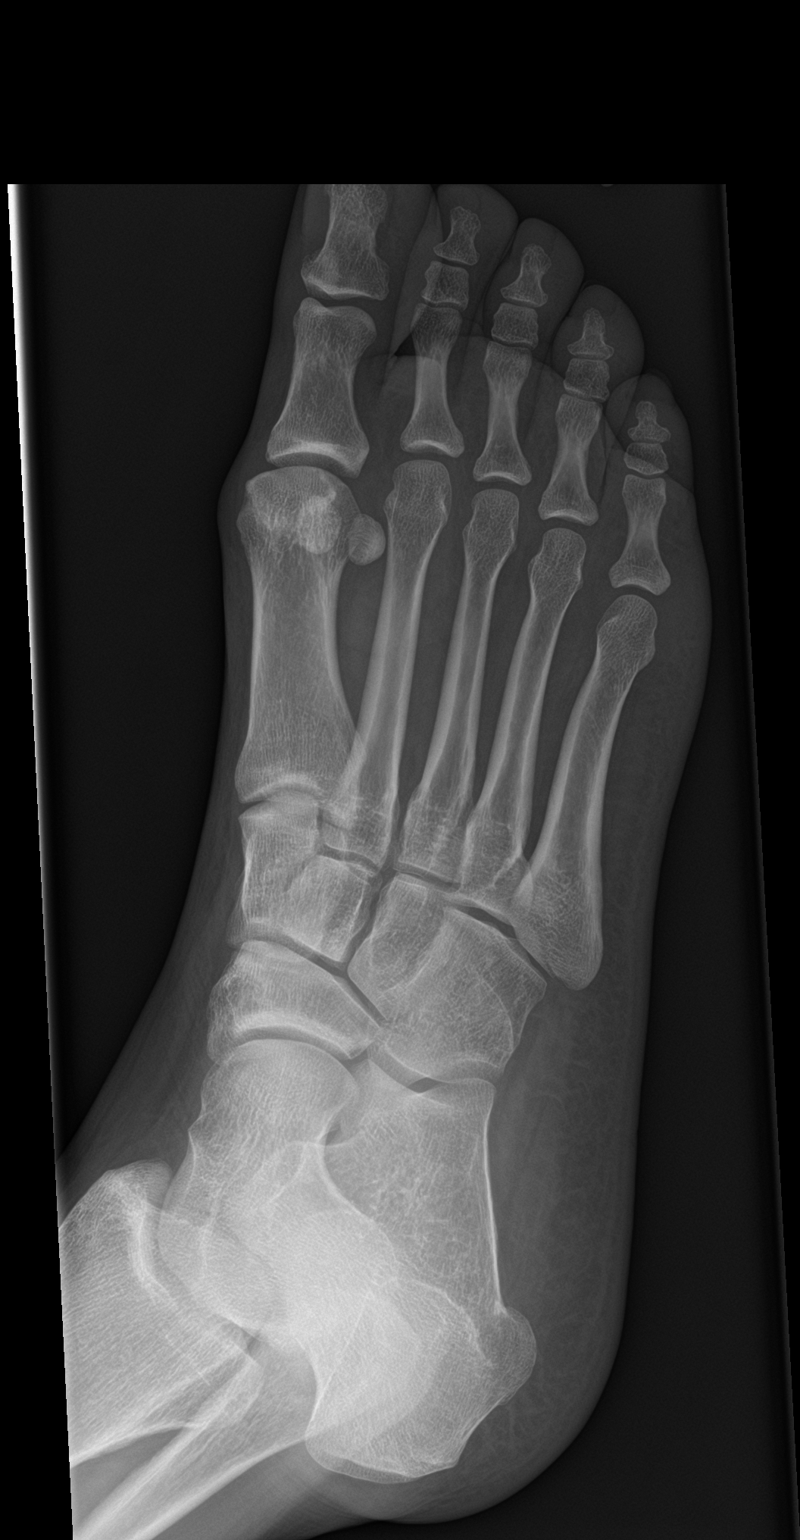

[foot lat]
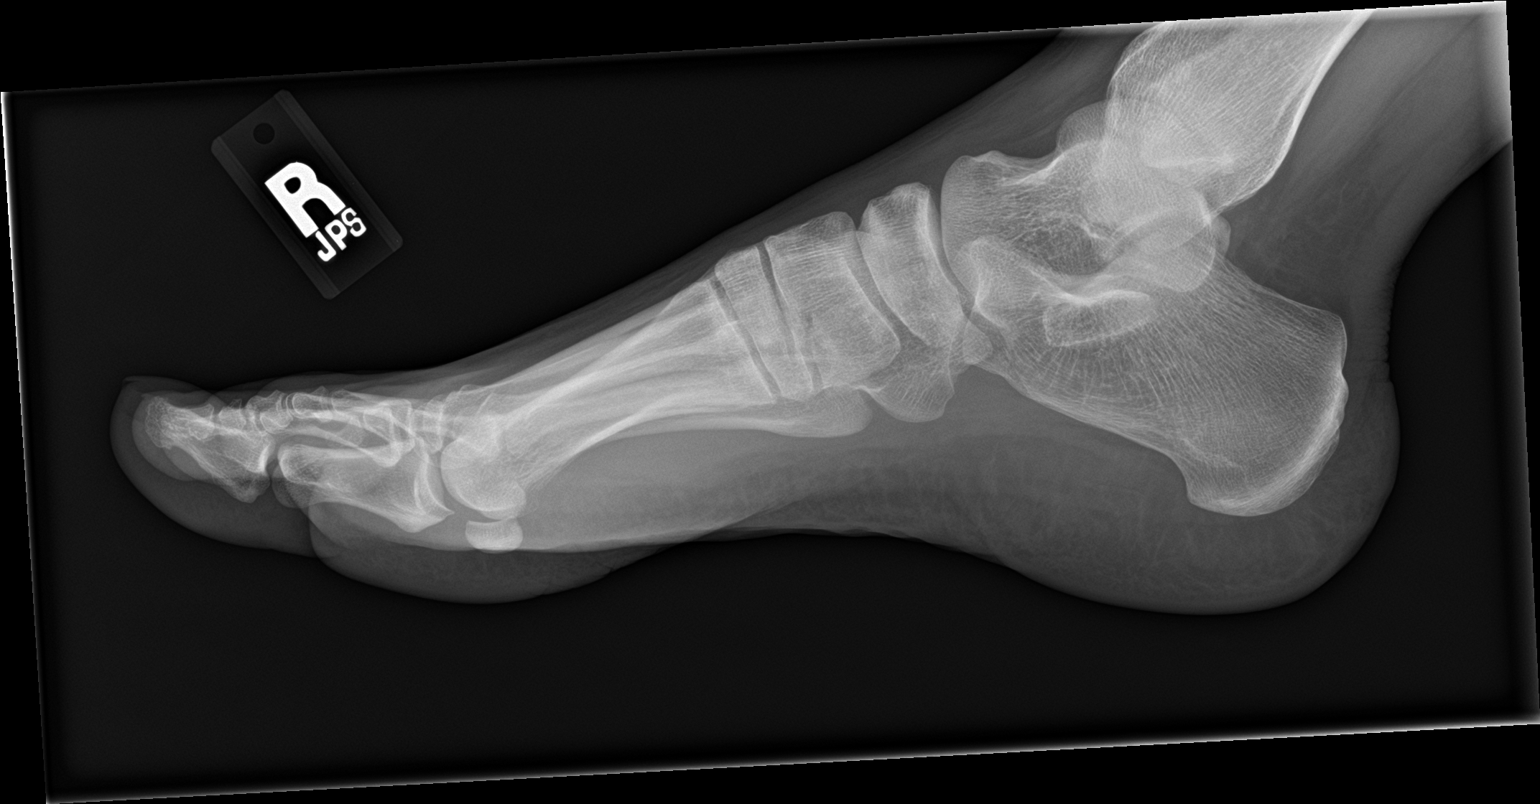

[3 of 3 positions shown; findings below may reference images not displayed]

FINDINGS: Alignment is anatomic. There is no acute fracture. Joint spaces are
preserved.
IMPRESSION: No acute fracture.

## 2020-11-06 ENCOUNTER — Ambulatory Visit (HOSPITAL_COMMUNITY)
Admission: EM | Admit: 2020-11-06 | Discharge: 2020-11-06 | Disposition: A | Discharge status: Home / Self Care | Payer: Self-pay | Attending: Internal Medicine | Admitting: Internal Medicine

## 2020-11-06 ENCOUNTER — Encounter (HOSPITAL_COMMUNITY): Payer: Self-pay

## 2020-11-06 ENCOUNTER — Other Ambulatory Visit: Payer: Self-pay

## 2020-11-06 DIAGNOSIS — R202 Paresthesia of skin: Secondary | ICD-10-CM

## 2020-11-06 DIAGNOSIS — G629 Polyneuropathy, unspecified: Secondary | ICD-10-CM

## 2020-11-06 LAB — POCT URINALYSIS DIPSTICK, ED / UC
Bilirubin Urine: NEGATIVE
Glucose, UA: NEGATIVE mg/dL
Ketones, ur: NEGATIVE mg/dL
Leukocytes,Ua: NEGATIVE
Nitrite: NEGATIVE
Protein, ur: NEGATIVE mg/dL
Specific Gravity, Urine: 1.025 (ref 1.005–1.030)
Urobilinogen, UA: 0.2 mg/dL (ref 0.0–1.0)
pH: 5 (ref 5.0–8.0)

## 2020-11-06 NOTE — ED Provider Notes (Signed)
[facility]    CSN: [id number] Arrival date & time: 11/06/20  1497      History   Chief Complaint Chief Complaint  Patient presents with  . Fatigue  . Numbness    HPI Susan Mcgrath is a 40 y.o. female otherwise healthy presents to urgent care today with complaints of numbness and tingling bilaterally to upper and lower extremities.  Patient states symptoms ongoing for years but presented today as it is interfering with her sleep.  Numbness and tingling described as starting in bottom of feet now extending throughout legs and bilaterally to arms.  Symptoms constant in nature with no alleviating factors and worse when lying down for extended period of time.  She denies any recent fever or chills, abdominal pain, N/V/D, no unintentional weight loss.  She does endorse some occasional midsternal, nonradiating chest pain worse when smoking cigarettes.  She also reports some excessive thirst and urination without polyphagia.  It appears patient presented to ED in 2013 for similar symptoms.   Past Medical History:  Diagnosis Date  . Fracture of right ankle 04/21/11   Had casting no surgery   . Kidney stone 2004   Had stone removal     Patient Active Problem List   Diagnosis Date Noted  . Neuropathy 12/14/2011    Past Surgical History:  Procedure Laterality Date  . KIDNEY STONE SURGERY      OB History    Gravida  3   Para  2   Term  2   Preterm      AB  1   Living  2     SAB      IAB  1   Ectopic      Multiple      Live Births               Home Medications    Prior to Admission medications   Medication Sig Start Date End Date Taking? Authorizing Provider  cyclobenzaprine (FLEXERIL) 10 MG tablet Take 1 tablet (10 mg total) by mouth 2 (two) times daily as needed for muscle spasms. 06/13/19   Walisiewicz, Yvonna Alanis E, PA-C  lidocaine (LMX) 4 % cream Apply 1 application topically 3 (three) times daily as needed. 07/02/16   Pisciotta, [name], PA-C   methocarbamol (ROBAXIN) 500 MG tablet Can take up to 1-2 tabs every 6 hours PRN PAIN 07/02/16   Pisciotta, [name], PA-C  omeprazole (PRILOSEC) 40 MG capsule Take 1 capsule (40 mg total) by mouth daily. 01/23/15   [name], MD  terbinafine (LAMISIL) 250 MG tablet Take 1 tablet (250 mg total) by mouth daily. 07/09/16   Dorena Bodo, NP    Family History Family History  Problem Relation Age of Onset  . Alzheimer's disease Maternal Grandmother   . Alzheimer's disease Paternal Grandmother     Social History Social History   Tobacco Use  . Smoking status: Former Smoker    Packs/day: 0.05    Years: 2.00    Pack years: 0.10    Types: Cigarettes  . Smokeless tobacco: Never Used  Substance Use Topics  . Alcohol use: Yes    Comment: Socially. Smokes when she drinks.   . Drug use: No     Allergies   Patient has no known allergies.   Review of Systems As stated in HPI otherwise negative   Physical Exam Triage Vital Signs ED Triage Vitals  Enc Vitals Group     BP 11/06/20 0839 118/65  Pulse Rate 11/06/20 0839 98     Resp 11/06/20 0839 18     Temp 11/06/20 0839 98.6 F (37 C)     Temp src --      SpO2 11/06/20 0839 97 %     Weight --      Height --      Head Circumference --      Peak Flow --      Pain Score 11/06/20 0837 7     Pain Loc --      Pain Edu? --      Excl. in GC? --    No data found.  Updated Vital Signs BP 118/65 (BP Location: Left Arm)   Pulse 98   Temp 98.6 F (37 C)   Resp 18   LMP 10/19/2020 (Approximate)   SpO2 97%   Visual Acuity Right Eye Distance:   Left Eye Distance:   Bilateral Distance:    Right Eye Near:   Left Eye Near:    Bilateral Near:     Physical Exam Constitutional:      General: She is not in acute distress.    Appearance: Normal appearance. She is not ill-appearing, toxic-appearing or diaphoretic.  HENT:     Head: Normocephalic and atraumatic.     Nose: No congestion or rhinorrhea.     Mouth/Throat:      Mouth: Mucous membranes are moist.     Pharynx: Oropharynx is clear.  Eyes:     Extraocular Movements: Extraocular movements intact.     Conjunctiva/sclera: Conjunctivae normal.     Pupils: Pupils are equal, round, and reactive to light.  Neck:     Vascular: No carotid bruit.  Cardiovascular:     Rate and Rhythm: Normal rate and regular rhythm.     Heart sounds: No murmur heard. No friction rub. No gallop.   Pulmonary:     Effort: Pulmonary effort is normal.     Breath sounds: Normal breath sounds. No wheezing, rhonchi or rales.  Abdominal:     General: Bowel sounds are normal.     Palpations: Abdomen is soft.     Tenderness: There is no abdominal tenderness. There is no right CVA tenderness, left CVA tenderness or guarding.  Musculoskeletal:        General: Normal range of motion.     Cervical back: Normal range of motion and neck supple. No rigidity or tenderness.  Lymphadenopathy:     Cervical: No cervical adenopathy.  Skin:    General: Skin is warm and dry.  Neurological:     General: No focal deficit present.     Mental Status: She is alert and oriented to person, place, and time.     Cranial Nerves: No cranial nerve deficit.     Sensory: No sensory deficit.     Motor: No weakness.     Gait: Gait normal.     Deep Tendon Reflexes: Reflexes normal.     Comments: No facial asymmetry, tongue midline, DTRs symmetric throughout, 5 out of 5 strength in upper and lower extremities  Psychiatric:        Mood and Affect: Mood normal.        Behavior: Behavior normal.      UC Treatments / Results  Labs (all labs ordered are listed, but only abnormal results are displayed) Labs Reviewed  POCT URINALYSIS DIPSTICK, ED / UC - Abnormal; Notable for the following components:      Result Value   Hgb  urine dipstick MODERATE (*)    All other components within normal limits    EKG   Radiology No results found.  Procedures Procedures (including critical care  time)  Medications Ordered in UC Medications - No data to display  Initial Impression / Assessment and Plan / UC Course  I have reviewed the triage vital signs and the nursing notes.  Pertinent labs & imaging results that were available during my care of the patient were reviewed by me and considered in my medical decision making (see chart for details).  Parasthesis -Patient nontoxic-appearing, exam unremarkable, VSS.  Neuro exam completely normal with no suspicion for neurovascular incident -Consider peripheral neuropathy.  Would benefit from full lab work-up by PCP.  Referrals given -EKG NSR with no evidence of ischemia.  UA negative for glucose  Reviewed expections re: course of current medical issues. Questions answered. Outlined signs and symptoms indicating need for more acute intervention. Pt verbalized understanding. AVS given  Final Clinical Impressions(s) / UC Diagnoses   Final diagnoses:  Neuropathy  Paresthesias     Discharge Instructions     Your EKG looks very reassuring.  Your symptoms would be better worked up by a primary care practice.  I have given you a couple numbers to call to try to establish care.  Please return for any worsening symptoms.    ED Prescriptions    None     PDMP not reviewed this encounter.   Rolla Etienne, NP 11/06/20 [phone]

## 2020-11-06 NOTE — ED Triage Notes (Signed)
Pt in with c/o bilateral leg and feet numbness and tingling that has been going on for over 1 month  Pt also c/o fatigue, CP, sob at times   Pt states she was seen by a provider for the same issue but was unable to be diagnosed

## 2020-11-06 NOTE — Discharge Instructions (Signed)
Your EKG looks very reassuring.  Your symptoms would be better worked up by a primary care practice.  I have given you a couple numbers to call to try to establish care.  Please return for any worsening symptoms.

## 2022-08-15 ENCOUNTER — Ambulatory Visit (HOSPITAL_COMMUNITY)
Admission: EM | Admit: 2022-08-15 | Discharge: 2022-08-15 | Disposition: A | Discharge status: Home / Self Care | Payer: Medicaid Other | Attending: Physician Assistant | Admitting: Physician Assistant

## 2022-08-15 ENCOUNTER — Encounter (HOSPITAL_COMMUNITY): Payer: Self-pay | Admitting: *Deleted

## 2022-08-15 ENCOUNTER — Other Ambulatory Visit: Payer: Self-pay

## 2022-08-15 DIAGNOSIS — J069 Acute upper respiratory infection, unspecified: Secondary | ICD-10-CM

## 2022-08-15 DIAGNOSIS — H6501 Acute serous otitis media, right ear: Secondary | ICD-10-CM

## 2022-08-15 DIAGNOSIS — H9201 Otalgia, right ear: Secondary | ICD-10-CM

## 2022-08-15 MED ORDER — KETOROLAC TROMETHAMINE 30 MG/ML IJ SOLN
INTRAMUSCULAR | Status: AC
  Filled 2022-08-15: qty 1

## 2022-08-15 MED ORDER — HYDROCODONE-ACETAMINOPHEN 5-325 MG PO TABS: 1.0000 | ORAL_TABLET | ORAL | 0 refills | Status: AC | PRN

## 2022-08-15 MED ORDER — KETOROLAC TROMETHAMINE 30 MG/ML IJ SOLN
30.0000 mg | Freq: Once | INTRAMUSCULAR | Status: AC
  Administered 2022-08-15: 30 mg via INTRAMUSCULAR

## 2022-08-15 MED ORDER — AMOXICILLIN 500 MG PO CAPS: 500.0000 mg | ORAL_CAPSULE | Freq: Three times a day (TID) | ORAL | 0 refills | Status: AC

## 2022-08-15 NOTE — ED Triage Notes (Signed)
Pt reports Rt ear pain started last night.

## 2022-08-15 NOTE — ED Provider Notes (Signed)
[facility]    CSN: [id number] Arrival date & time: 08/15/22  1549      History   Chief Complaint Chief Complaint  Patient presents with   Otalgia    HPI Susan Mcgrath is a 42 y.o. female.   69-year-old female presents with severe right ear pain.  Patient indicates that she has had mild upper respiratory congestion with rhinitis which is mainly been clear for couple days.  She indicates that last night she started with severe right ear pain, soreness, discomfort.  She relates she has been taking ibuprofen and Tylenol without relief of her pain.  Patient indicates that his pain is moving from the right ear and starting to affect the right cheek and right jaw.  She is without fever, chills, body aches.  She is without nausea or vomiting and is tolerating fluids well.  Patient indicates that her right ear hurts extremely bad, and desires to have medication that we will give her relief from the discomfort.  Patient agreed to have an injection of Toradol for pain relief.   Otalgia   Past Medical History:  Diagnosis Date   Fracture of right ankle 04/21/11   Had casting no surgery    Kidney stone 2004   Had stone removal     Patient Active Problem List   Diagnosis Date Noted   Neuropathy 12/14/2011    Past Surgical History:  Procedure Laterality Date   KIDNEY STONE SURGERY      OB History     Gravida  3   Para  2   Term  2   Preterm      AB  1   Living  2      SAB      IAB  1   Ectopic      Multiple      Live Births               Home Medications    Prior to Admission medications   Medication Sig Start Date End Date Taking? Authorizing Provider  amoxicillin (AMOXIL) 500 MG capsule Take 1 capsule (500 mg total) by mouth 3 (three) times daily. 08/15/22  Yes Nyoka Lint, PA-C  HYDROcodone-acetaminophen (NORCO/VICODIN) 5-325 MG tablet Take 1-2 tablets by mouth every 4 (four) hours as needed. 08/15/22  Yes Nyoka Lint, PA-C   cyclobenzaprine (FLEXERIL) 10 MG tablet Take 1 tablet (10 mg total) by mouth 2 (two) times daily as needed for muscle spasms. 06/13/19   Walisiewicz, Verline Lema E, PA-C  lidocaine (LMX) 4 % cream Apply 1 application topically 3 (three) times daily as needed. 07/02/16   Pisciotta, Elmyra Ricks, PA-C  methocarbamol (ROBAXIN) 500 MG tablet Can take up to 1-2 tabs every 6 hours PRN PAIN 07/02/16   Pisciotta, Elmyra Ricks, PA-C  omeprazole (PRILOSEC) 40 MG capsule Take 1 capsule (40 mg total) by mouth daily. 01/23/15   Alveda Reasons, MD  terbinafine (LAMISIL) 250 MG tablet Take 1 tablet (250 mg total) by mouth daily. 07/09/16   Barnet Glasgow, NP    Family History Family History  Problem Relation Age of Onset   Alzheimer's disease Maternal Grandmother    Alzheimer's disease Paternal Grandmother     Social History Social History   Tobacco Use   Smoking status: Former    Packs/day: 0.05    Years: 2.00    Total pack years: 0.10    Types: Cigarettes   Smokeless tobacco: Never  Substance Use Topics   Alcohol use:  Yes    Comment: Socially. Smokes when she drinks.    Drug use: No     Allergies   Patient has no known allergies.   Review of Systems Review of Systems  HENT:  Positive for ear pain (right ear).      Physical Exam Triage Vital Signs ED Triage Vitals  Enc Vitals Group     BP 08/15/22 1559 111/76     Pulse Rate 08/15/22 1559 77     Resp 08/15/22 1559 18     Temp 08/15/22 1559 98.6 F (37 C)     Temp src --      SpO2 08/15/22 1559 98 %     Weight --      Height --      Head Circumference --      Peak Flow --      Pain Score 08/15/22 1557 10     Pain Loc --      Pain Edu? --      Excl. in [place]? --    No data found.  Updated Vital Signs BP 111/76   Pulse 77   Temp 98.6 F (37 C)   Resp 18   LMP 06/21/2022   SpO2 98%   Visual Acuity Right Eye Distance:   Left Eye Distance:   Bilateral Distance:    Right Eye Near:   Left Eye Near:    Bilateral Near:      Physical Exam Constitutional:      Appearance: Normal appearance.  HENT:     Right Ear: Ear canal normal. There is impacted cerumen.     Left Ear: Ear canal normal. There is impacted cerumen.     Ears:     Comments: Ears: There is cerumen blocking both ear canals making the TMs not visible.  Due to the degree of the patient's pain on the right ear unable to irrigate today due to pain.    Mouth/Throat:     Mouth: Mucous membranes are moist.     Pharynx: Oropharynx is clear.  Cardiovascular:     Rate and Rhythm: Normal rate and regular rhythm.     Heart sounds: Normal heart sounds.  Lymphadenopathy:     Cervical: No cervical adenopathy.  Neurological:     Mental Status: She is alert.      UC Treatments / Results  Labs (all labs ordered are listed, but only abnormal results are displayed) Labs Reviewed - No data to display  EKG   Radiology No results found.  Procedures Procedures (including critical care time)  Medications Ordered in UC Medications  ketorolac (TORADOL) 30 MG/ML injection 30 mg (30 mg Intramuscular Given 08/15/22 1635)    Initial Impression / Assessment and Plan / UC Course  I have reviewed the triage vital signs and the nursing notes.  Pertinent labs & imaging results that were available during my care of the patient were reviewed by me and considered in my medical decision making (see chart for details).    Plan: The diagnosis will be treated with the following: 1.  Upper respiratory tract infection: A.  Advised to use OTC medications to control cough and congestion. 2.  Right ear pain: A.  Vicodin tablets 5/325 mg, 1-2 every 6-8 hours needed for acute pain relief. 3.  Acute otitis media right ear: A.  Amoxil 500 mg every 8 hours to treat ear infection. 4.  Patient advised that once the infection, pain resolves and clears she will need to  have both the ears flushed to remove the wax from the canals. 5.  Advised follow-up PCP or return to  urgent care as needed. Final Clinical Impressions(s) / UC Diagnoses   Final diagnoses:  Viral upper respiratory tract infection  Otalgia of right ear  Non-recurrent acute serous otitis media of right ear     Discharge Instructions      Advised take the amoxicillin 500 mg 3 times a day to help treat infection. Advised take the Vicodin tablets for pain, 1-2 every 8 hours as needed for pain relief.  (Be cautious with this medication as it does cause drowsiness)  Advised to follow-up PCP or return to urgent care as needed.    ED Prescriptions     Medication Sig Dispense Auth. Provider   amoxicillin (AMOXIL) 500 MG capsule Take 1 capsule (500 mg total) by mouth 3 (three) times daily. 21 capsule Nyoka Lint, PA-C   HYDROcodone-acetaminophen (NORCO/VICODIN) 5-325 MG tablet Take 1-2 tablets by mouth every 4 (four) hours as needed. 14 tablet Nyoka Lint, PA-C      I have reviewed the PDMP during this encounter.   Nyoka Lint, PA-C 08/15/22 1639

## 2022-08-15 NOTE — Discharge Instructions (Signed)
Advised take the amoxicillin 500 mg 3 times a day to help treat infection. Advised take the Vicodin tablets for pain, 1-2 every 8 hours as needed for pain relief.  (Be cautious with this medication as it does cause drowsiness)  Advised to follow-up PCP or return to urgent care as needed.
